# Patient Record
Sex: Female | Born: 1975 | Race: Black or African American | Hispanic: No | Marital: Married | Smoking: Current every day smoker
Health system: Southern US, Community
[De-identification: ages and names within clinical notes are randomized; demographics above are authoritative.]

## PROBLEM LIST (undated history)

## (undated) DIAGNOSIS — M436 Torticollis: Secondary | ICD-10-CM

## (undated) DIAGNOSIS — I10 Essential (primary) hypertension: Secondary | ICD-10-CM

## (undated) DIAGNOSIS — E785 Hyperlipidemia, unspecified: Secondary | ICD-10-CM

## (undated) DIAGNOSIS — M5481 Occipital neuralgia: Secondary | ICD-10-CM

## (undated) HISTORY — DX: Torticollis: M43.6

## (undated) HISTORY — DX: Hyperlipidemia, unspecified: E78.5

---

## 2000-01-13 HISTORY — PX: TUBAL LIGATION: SHX77

## 2004-04-03 ENCOUNTER — Ambulatory Visit: Payer: Self-pay | Admitting: Internal Medicine

## 2004-04-04 ENCOUNTER — Ambulatory Visit: Payer: Self-pay | Admitting: Internal Medicine

## 2004-09-22 ENCOUNTER — Emergency Department: Payer: Self-pay | Admitting: Emergency Medicine

## 2005-04-07 ENCOUNTER — Ambulatory Visit: Payer: Self-pay | Admitting: Family Medicine

## 2005-09-30 ENCOUNTER — Ambulatory Visit: Payer: Self-pay | Admitting: Otolaryngology

## 2005-10-01 ENCOUNTER — Ambulatory Visit: Payer: Self-pay | Admitting: Otolaryngology

## 2005-10-21 ENCOUNTER — Ambulatory Visit: Payer: Self-pay

## 2005-11-14 ENCOUNTER — Ambulatory Visit: Payer: Self-pay | Admitting: Urology

## 2005-11-15 ENCOUNTER — Emergency Department: Payer: Self-pay | Admitting: Emergency Medicine

## 2005-11-16 ENCOUNTER — Ambulatory Visit: Payer: Self-pay | Admitting: Emergency Medicine

## 2006-12-28 ENCOUNTER — Ambulatory Visit: Payer: Self-pay | Admitting: Gastroenterology

## 2007-05-25 ENCOUNTER — Ambulatory Visit: Payer: Self-pay | Admitting: Internal Medicine

## 2007-05-27 ENCOUNTER — Ambulatory Visit: Payer: Self-pay | Admitting: Internal Medicine

## 2007-06-02 ENCOUNTER — Ambulatory Visit: Payer: Self-pay | Admitting: Internal Medicine

## 2007-08-08 ENCOUNTER — Ambulatory Visit: Payer: Self-pay | Admitting: Family

## 2007-08-15 ENCOUNTER — Ambulatory Visit: Payer: Self-pay | Admitting: Unknown Physician Specialty

## 2012-01-13 LAB — HM MAMMOGRAPHY: HM Mammogram: NORMAL (ref 0–4)

## 2013-01-12 LAB — HM PAP SMEAR: HM PAP: NORMAL

## 2014-01-12 LAB — HM COLONOSCOPY

## 2014-07-31 ENCOUNTER — Encounter: Payer: Self-pay | Admitting: Internal Medicine

## 2014-07-31 ENCOUNTER — Other Ambulatory Visit: Payer: Self-pay | Admitting: Internal Medicine

## 2014-07-31 DIAGNOSIS — I10 Essential (primary) hypertension: Secondary | ICD-10-CM | POA: Insufficient documentation

## 2014-07-31 DIAGNOSIS — F17200 Nicotine dependence, unspecified, uncomplicated: Secondary | ICD-10-CM | POA: Insufficient documentation

## 2014-07-31 DIAGNOSIS — M5481 Occipital neuralgia: Secondary | ICD-10-CM | POA: Insufficient documentation

## 2014-09-01 ENCOUNTER — Emergency Department
Admission: EM | Admit: 2014-09-01 | Discharge: 2014-09-01 | Disposition: A | Discharge status: Home / Self Care | Payer: 59 | Attending: Emergency Medicine | Admitting: Emergency Medicine

## 2014-09-01 DIAGNOSIS — Z720 Tobacco use: Secondary | ICD-10-CM | POA: Diagnosis not present

## 2014-09-01 DIAGNOSIS — Z79899 Other long term (current) drug therapy: Secondary | ICD-10-CM | POA: Diagnosis not present

## 2014-09-01 DIAGNOSIS — M542 Cervicalgia: Secondary | ICD-10-CM | POA: Diagnosis present

## 2014-09-01 DIAGNOSIS — I10 Essential (primary) hypertension: Secondary | ICD-10-CM | POA: Diagnosis not present

## 2014-09-01 DIAGNOSIS — M436 Torticollis: Secondary | ICD-10-CM | POA: Diagnosis not present

## 2014-09-01 HISTORY — DX: Occipital neuralgia: M54.81

## 2014-09-01 HISTORY — DX: Essential (primary) hypertension: I10

## 2014-09-01 MED ORDER — CYCLOBENZAPRINE HCL 10 MG PO TABS: 10.0000 mg | ORAL_TABLET | Freq: Three times a day (TID) | ORAL | Status: DC | PRN

## 2014-09-01 MED ORDER — ORPHENADRINE CITRATE 30 MG/ML IJ SOLN
60.0000 mg | Freq: Two times a day (BID) | INTRAMUSCULAR | Status: DC
  Administered 2014-09-01: 60 mg via INTRAMUSCULAR
  Filled 2014-09-01: qty 2

## 2014-09-01 MED ORDER — KETOROLAC TROMETHAMINE 60 MG/2ML IM SOLN
60.0000 mg | Freq: Once | INTRAMUSCULAR | Status: AC
  Administered 2014-09-01: 60 mg via INTRAMUSCULAR
  Filled 2014-09-01: qty 2

## 2014-09-01 MED ORDER — TRAMADOL HCL 50 MG PO TABS
50.0000 mg | ORAL_TABLET | Freq: Once | ORAL | Status: AC
  Administered 2014-09-01: 50 mg via ORAL
  Filled 2014-09-01: qty 1

## 2014-09-01 MED ORDER — IBUPROFEN 800 MG PO TABS: 800.0000 mg | ORAL_TABLET | Freq: Three times a day (TID) | ORAL | Status: DC | PRN

## 2014-09-01 MED ORDER — TRAMADOL HCL 50 MG PO TABS: 50.0000 mg | ORAL_TABLET | Freq: Four times a day (QID) | ORAL | Status: DC | PRN

## 2014-09-01 NOTE — ED Notes (Signed)
Patient reports neck pain, especially left side, for several days.

## 2014-09-01 NOTE — ED Provider Notes (Signed)
[facility] Emergency Department Provider Note  ____________________________________________  Time seen: Approximately 7:51 AM  I have reviewed the triage vital signs and the nursing notes.   HISTORY  Chief Complaint Torticollis    HPI Felicia Garcia is a 39 y.o. female complaining of left neck pain times one week. Patient state awaken one week ago with neck pain status postand her pillow. Patient state pain not relieved with over-the-counter anti-inflammatory medications. Patient states she uses a TENS unit and had relief for 2 days but is no longer affect. Patient denies any radicular component to her pain and no loss of function of the upper extremities. Patient is rating the pain as a 10 over 10 and describes a sharp with any right lateral flexion motions of the neck. Denies any mid neck pain. Past Medical History  Diagnosis Date  . Hypertension   . Occipital neuralgia     Patient Active Problem List   Diagnosis Date Noted  . Essential (primary) hypertension 07/31/2014  . Cervico-occipital neuralgia 07/31/2014  . Compulsive tobacco user syndrome 07/31/2014    Past Surgical History  Procedure Laterality Date  . Tubal ligation      Current Outpatient Rx  Name  Route  Sig  Dispense  Refill  . chlorthalidone (HYGROTON) 25 MG tablet   Oral   Take 1 tablet by mouth daily.         . cyclobenzaprine (FLEXERIL) 10 MG tablet   Oral   Take 1 tablet (10 mg total) by mouth every 8 (eight) hours as needed for muscle spasms.   15 tablet   0   . gabapentin (NEURONTIN) 100 MG capsule   Oral   Take 1 capsule by mouth 4 (four) times daily.         Marland Kitchen ibuprofen (ADVIL,MOTRIN) 800 MG tablet   Oral   Take 1 tablet (800 mg total) by mouth every 8 (eight) hours as needed for moderate pain.   15 tablet   0   . traMADol (ULTRAM) 50 MG tablet   Oral   Take 1 tablet (50 mg total) by mouth every 6 (six) hours as needed for moderate pain.   12 tablet   0      Allergies Flu virus vaccine and Betadine  Family History  Problem Relation Age of Onset  . Hypertension Mother   . Hypertension Sister   . Diabetes Sister     Social History Social History  Substance Use Topics  . Smoking status: Current Every Day Smoker    Types: Cigarettes  . Smokeless tobacco: None  . Alcohol Use: 1.2 oz/week    2 Standard drinks or equivalent per week    Review of Systems Constitutional: No fever/chills Eyes: No visual changes. ENT: No sore throat. Cardiovascular: Denies chest pain. Respiratory: Denies shortness of breath. Gastrointestinal: No abdominal pain.  No nausea, no vomiting.  No diarrhea.  No constipation. Genitourinary: Negative for dysuria. Musculoskeletal: Negative for back pain. Skin: Negative for rash. Neurological: Negative for headaches, focal weakness or numbness. Endocrine:Hypertension Hematological/Lymphatic: Allergic/Immunilogical: See medication list  10-point ROS otherwise negative.  ____________________________________________   PHYSICAL EXAM:  VITAL SIGNS: ED Triage Vitals  Enc Vitals Group     BP 09/01/14 0641 127/85 mmHg     Pulse Rate 09/01/14 0641 95     Resp 09/01/14 0641 22     Temp 09/01/14 0641 98.2 F (36.8 C)     Temp Source 09/01/14 0641 Oral     SpO2 09/01/14  0641 96 %     Weight 09/01/14 0641 191 lb (86.637 kg)     Height 09/01/14 0641 5' 3.5" (1.613 m)     Head Cir --      Peak Flow --      Pain Score 09/01/14 0641 10     Pain Loc --      Pain Edu? --      Excl. in GC? --     Constitutional: Alert and oriented. Well appearing and in no acute distress. Eyes: Conjunctivae are normal. PERRL. EOMI. Head: Atraumatic. Nose: No congestion/rhinnorhea. Mouth/Throat: Mucous membranes are moist.  Oropharynx non-erythematous. Neck: No stridor. No obvious spinal deformity.  No cervical spine tenderness to palpation. Patient has some moderate guarding palpation left paraspinal muscle group. Patient  has decreased range of motion with flexion and right lateral motions. Hematological/Lymphatic/Immunilogical: No cervical lymphadenopathy. Cardiovascular: Normal rate, regular rhythm. Grossly normal heart sounds.  Good peripheral circulation. Respiratory: Normal respiratory effort.  No retractions. Lungs CTAB. Gastrointestinal: Soft and nontender. No distention. No abdominal bruits. No CVA tenderness. Musculoskeletal: No lower extremity tenderness nor edema.  No joint effusions. Neurologic:  Normal speech and language. No gross focal neurologic deficits are appreciated. No gait instability. Skin:  Skin is warm, dry and intact. No rash noted. Psychiatric: Mood and affect are normal. Speech and behavior are normal.  ____________________________________________   LABS (all labs ordered are listed, but only abnormal results are displayed)  Labs Reviewed - No data to display ____________________________________________  EKG   ____________________________________________  RADIOLOGY   ____________________________________________   PROCEDURES  Procedure(s) performed: None  Critical Care performed: No  ____________________________________________   INITIAL IMPRESSION / ASSESSMENT AND PLAN / ED COURSE  Pertinent labs & imaging results that were available during my care of the patient were reviewed by me and considered in my medical decision making (see chart for details).  Torticollis. Patient given injection of Norflex 60 mg, Toradol 60 mg, and tramadol 50 mg by mouth. Patient discharged home with instruction for her condition. Patient given a prescription for ibuprofen, Flexeril, and tramadol. Patient advised follow-up with her PCP if condition persists.  ____________________________________________   FINAL CLINICAL IMPRESSION(S) / ED DIAGNOSES  Final diagnoses:  Torticollis, acute      Joni Reining, PA-C 09/01/14 1610  Minna Antis, MD 09/01/14 [phone]

## 2015-01-03 ENCOUNTER — Other Ambulatory Visit: Payer: Self-pay | Admitting: Internal Medicine

## 2015-01-10 NOTE — Telephone Encounter (Signed)
Pt had a heart attack last week and is going to a heart specialist and said she would call back to make this appt

## 2015-01-17 DIAGNOSIS — R0789 Other chest pain: Secondary | ICD-10-CM | POA: Insufficient documentation

## 2015-01-18 DIAGNOSIS — R0601 Orthopnea: Secondary | ICD-10-CM | POA: Insufficient documentation

## 2015-01-19 ENCOUNTER — Other Ambulatory Visit: Payer: Self-pay | Admitting: Internal Medicine

## 2015-01-21 DIAGNOSIS — E782 Mixed hyperlipidemia: Secondary | ICD-10-CM | POA: Insufficient documentation

## 2015-03-22 ENCOUNTER — Other Ambulatory Visit: Payer: Self-pay | Admitting: Internal Medicine

## 2015-04-15 DIAGNOSIS — F410 Panic disorder [episodic paroxysmal anxiety] without agoraphobia: Secondary | ICD-10-CM | POA: Insufficient documentation

## 2015-04-15 DIAGNOSIS — Z860101 Personal history of adenomatous and serrated colon polyps: Secondary | ICD-10-CM | POA: Insufficient documentation

## 2015-04-15 DIAGNOSIS — Z86010 Personal history of colonic polyps: Secondary | ICD-10-CM | POA: Insufficient documentation

## 2016-10-02 ENCOUNTER — Emergency Department
Admission: EM | Admit: 2016-10-02 | Discharge: 2016-10-03 | Disposition: A | Discharge status: Home / Self Care | Payer: BLUE CROSS/BLUE SHIELD | Attending: Student in an Organized Health Care Education/Training Program | Admitting: Student in an Organized Health Care Education/Training Program

## 2016-10-02 ENCOUNTER — Encounter: Payer: Self-pay | Admitting: Emergency Medicine

## 2016-10-02 ENCOUNTER — Emergency Department: Payer: BLUE CROSS/BLUE SHIELD

## 2016-10-02 DIAGNOSIS — Z79899 Other long term (current) drug therapy: Secondary | ICD-10-CM | POA: Diagnosis not present

## 2016-10-02 DIAGNOSIS — F17210 Nicotine dependence, cigarettes, uncomplicated: Secondary | ICD-10-CM | POA: Diagnosis not present

## 2016-10-02 DIAGNOSIS — K5641 Fecal impaction: Secondary | ICD-10-CM | POA: Diagnosis not present

## 2016-10-02 DIAGNOSIS — I10 Essential (primary) hypertension: Secondary | ICD-10-CM | POA: Diagnosis not present

## 2016-10-02 DIAGNOSIS — K6289 Other specified diseases of anus and rectum: Secondary | ICD-10-CM | POA: Diagnosis present

## 2016-10-02 DIAGNOSIS — R197 Diarrhea, unspecified: Secondary | ICD-10-CM | POA: Insufficient documentation

## 2016-10-02 DIAGNOSIS — K5900 Constipation, unspecified: Secondary | ICD-10-CM

## 2016-10-02 LAB — COMPREHENSIVE METABOLIC PANEL
ALBUMIN: 4.4 g/dL (ref 3.5–5.0)
ALK PHOS: 69 U/L (ref 38–126)
ALT: 17 U/L (ref 14–54)
AST: 18 U/L (ref 15–41)
Anion gap: 9 (ref 5–15)
BILIRUBIN TOTAL: 0.8 mg/dL (ref 0.3–1.2)
BUN: 10 mg/dL (ref 6–20)
CALCIUM: 9.3 mg/dL (ref 8.9–10.3)
CO2: 28 mmol/L (ref 22–32)
CREATININE: 0.89 mg/dL (ref 0.44–1.00)
Chloride: 100 mmol/L — ABNORMAL LOW (ref 101–111)
GFR calc Af Amer: >60 mL/min (ref >60)
GFR calc non Af Amer: >60 mL/min (ref >60)
GLUCOSE: 155 mg/dL — AB (ref 65–99)
Potassium: 3.9 mmol/L (ref 3.5–5.1)
SODIUM: 137 mmol/L (ref 135–145)
Total Protein: 7.6 g/dL (ref 6.5–8.1)

## 2016-10-02 LAB — CBC WITH DIFFERENTIAL/PLATELET
BASOS PCT: 1 %
Basophils Absolute: 0.1 10*3/uL (ref 0–0.1)
EOS ABS: 0 10*3/uL (ref 0–0.7)
Eosinophils Relative: 0 %
HEMATOCRIT: 44.4 % (ref 35.0–47.0)
Hemoglobin: 15.5 g/dL (ref 12.0–16.0)
LYMPHS ABS: 1.1 10*3/uL (ref 1.0–3.6)
Lymphocytes Relative: 5 %
MCH: 32.9 pg (ref 26.0–34.0)
MCHC: 34.9 g/dL (ref 32.0–36.0)
MCV: 94.4 fL (ref 80.0–100.0)
MONO ABS: 1.1 10*3/uL — AB (ref 0.2–0.9)
MONOS PCT: 5 %
Neutro Abs: 18.2 10*3/uL — ABNORMAL HIGH (ref 1.4–6.5)
Neutrophils Relative %: 89 %
Platelets: 338 10*3/uL (ref 150–440)
RBC: 4.7 MIL/uL (ref 3.80–5.20)
RDW: 12.5 % (ref 11.5–14.5)
WBC: 20.5 10*3/uL — ABNORMAL HIGH (ref 3.6–11.0)

## 2016-10-02 LAB — POCT PREGNANCY, URINE: Preg Test, Ur: NEGATIVE

## 2016-10-02 MED ORDER — DOXYCYCLINE HYCLATE 100 MG PO TABS
100.0000 mg | ORAL_TABLET | Freq: Once | ORAL | Status: AC
  Administered 2016-10-02: 100 mg via ORAL
  Filled 2016-10-02: qty 1

## 2016-10-02 MED ORDER — DOXYCYCLINE HYCLATE 100 MG PO TABS
100.0000 mg | ORAL_TABLET | Freq: Two times a day (BID) | ORAL | 0 refills | Status: AC
Start: 2016-10-02 — End: 2016-10-14

## 2016-10-02 MED ORDER — FLUCONAZOLE 150 MG PO TABS: 150.0000 mg | ORAL_TABLET | Freq: Every day | ORAL | 0 refills | Status: AC

## 2016-10-02 MED ORDER — CEFTRIAXONE SODIUM 250 MG IJ SOLR
250.0000 mg | Freq: Once | INTRAMUSCULAR | Status: AC
  Administered 2016-10-02: 250 mg via INTRAMUSCULAR
  Filled 2016-10-02: qty 250

## 2016-10-02 MED ORDER — IOPAMIDOL (ISOVUE-300) INJECTION 61%
100.0000 mL | Freq: Once | INTRAVENOUS | Status: AC | PRN
  Administered 2016-10-02: 100 mL via INTRAVENOUS

## 2016-10-02 MED ORDER — LIDOCAINE HCL 2 % EX GEL
1.0000 "application " | Freq: Once | CUTANEOUS | Status: AC
  Administered 2016-10-02: 1 via URETHRAL
  Filled 2016-10-02: qty 5

## 2016-10-02 MED ORDER — DOCUSATE SODIUM 100 MG PO CAPS
100.0000 mg | ORAL_CAPSULE | Freq: Once | ORAL | Status: AC
  Administered 2016-10-02: 100 mg via ORAL
  Filled 2016-10-02: qty 1

## 2016-10-02 MED ORDER — PROMETHAZINE HCL 25 MG/ML IJ SOLN
12.5000 mg | Freq: Four times a day (QID) | INTRAMUSCULAR | Status: DC | PRN
  Administered 2016-10-02: 12.5 mg via INTRAVENOUS
  Filled 2016-10-02: qty 1

## 2016-10-02 MED ORDER — FENTANYL CITRATE (PF) 100 MCG/2ML IJ SOLN
100.0000 ug | INTRAMUSCULAR | Status: DC | PRN
  Administered 2016-10-02 – 2016-10-03 (×2): 100 ug via INTRAVENOUS
  Filled 2016-10-02 (×2): qty 2

## 2016-10-02 MED ORDER — LIDOCAINE HCL 2 % EX GEL
CUTANEOUS | Status: AC
  Filled 2016-10-02: qty 10

## 2016-10-02 MED ORDER — LIDOCAINE HCL 2 % EX GEL
1.0000 "application " | Freq: Once | CUTANEOUS | Status: AC
  Administered 2016-10-02: 1 via TOPICAL

## 2016-10-02 MED ORDER — PROMETHAZINE HCL 12.5 MG PO TABS: 12.5000 mg | ORAL_TABLET | Freq: Four times a day (QID) | ORAL | 0 refills | Status: DC | PRN

## 2016-10-02 MED ORDER — MAGNESIUM CITRATE PO SOLN
1.0000 | Freq: Once | ORAL | Status: AC
  Administered 2016-10-02: 1 via ORAL
  Filled 2016-10-02: qty 296

## 2016-10-02 NOTE — ED Notes (Signed)
ED Provider at bedside. 

## 2016-10-02 NOTE — Discharge Instructions (Signed)
please take antibiotics as distracted. Return for any fevers or worsening pain. Follow-up with gastroenterology.    You have been seen in the emergency department for emergency care. It is important that you contact your own doctor, specialist or the closest clinic for follow-up care. Please bring this instruction sheet, all medications and X-ray copies with you when you are seen for follow-up care.  Determining the exact cause for all patients with abdominal pain is extremely difficult in the emergency department. Our primary focus is to rule-out immediate life-threatening diseases. If no immediate source of pain is found the definitive diagnosis frequently needs to be determined over time.Many times your primary care physician can determine the cause by following the symptoms over time. Sometimes, specialist are required such as Gastroenterologists, Gynecologists, Urologists or Surgeons. Please return immediately to the Emergency Department for fever>101, Vomiting or Intractable Pain. You should return to the emergency department or see your primary care provider in 12-24hrs if your pain is no better and sooner if your pain becomes worse.

## 2016-10-02 NOTE — ED Provider Notes (Signed)
[facility] Emergency Department Provider Note    First MD Initiated Contact with Patient 10/02/16 1933     (approximate)  I have reviewed the triage vital signs and the nursing notes.   HISTORY  Chief Complaint Fecal Impaction    HPI Felicia Garcia is a 41 y.o. female presents with worsening perirectal pain and constipation for the past 5 days. Patient tried to self disimpact herself has not had any relief in symptoms. States that she is having just diarrhea after taking mag citrate as well as Dulcolax and MiraLAX. Is also having diffuse lower abdominal pain. Denies any dysuria. No vaginal discharge. Does have a history of hemorrhoids and feels that she's having significant pain from hemorrhoids.  Currently rates the pain as mild to moderate. No fevers.   Past Medical History:  Diagnosis Date  . Hypertension   . Occipital neuralgia    Family History  Problem Relation Age of Onset  . Hypertension Mother   . Hypertension Sister   . Diabetes Sister    Past Surgical History:  Procedure Laterality Date  . TUBAL LIGATION     Patient Active Problem List   Diagnosis Date Noted  . Essential (primary) hypertension 07/31/2014  . Cervico-occipital neuralgia 07/31/2014  . Compulsive tobacco user syndrome 07/31/2014      Prior to Admission medications   Medication Sig Start Date End Date Taking? Authorizing Provider  chlorthalidone (HYGROTON) 25 MG tablet take 1 tablet by mouth once daily 01/03/15   Reubin Milan, MD  cyclobenzaprine (FLEXERIL) 10 MG tablet Take 1 tablet (10 mg total) by mouth every 8 (eight) hours as needed for muscle spasms. 09/01/14   Joni Reining, PA-C  doxycycline (VIBRA-TABS) 100 MG tablet Take 1 tablet (100 mg total) by mouth 2 (two) times daily. 10/02/16 10/14/16  Willy Eddy, MD  fluconazole (DIFLUCAN) 150 MG tablet Take 1 tablet (150 mg total) by mouth daily. 10/02/16 10/05/16  Willy Eddy, MD  gabapentin  (NEURONTIN) 100 MG capsule Take 1 capsule by mouth 4 (four) times daily. 06/05/14   [provider]  ibuprofen (ADVIL,MOTRIN) 800 MG tablet Take 1 tablet (800 mg total) by mouth every 8 (eight) hours as needed for moderate pain. 09/01/14   Joni Reining, PA-C  promethazine (PHENERGAN) 12.5 MG tablet Take 1 tablet (12.5 mg total) by mouth every 6 (six) hours as needed for nausea or vomiting. 10/02/16   Willy Eddy, MD  traMADol (ULTRAM) 50 MG tablet Take 1 tablet (50 mg total) by mouth every 6 (six) hours as needed for moderate pain. 09/01/14   Joni Reining, PA-C    Allergies Flu virus vaccine and Betadine [povidone iodine]    Social History Social History  Substance Use Topics  . Smoking status: Current Every Day Smoker    Types: Cigarettes  . Smokeless tobacco: Never Used  . Alcohol use 1.2 oz/week    2 Standard drinks or equivalent per week    Review of Systems Patient denies headaches, rhinorrhea, blurry vision, numbness, shortness of breath, chest pain, edema, cough, abdominal pain, nausea, vomiting, diarrhea, dysuria, fevers, rashes or hallucinations unless otherwise stated above in HPI. ____________________________________________   PHYSICAL EXAM:  VITAL SIGNS: Vitals:   10/02/16 1735 10/02/16 2200  BP: (!) 131/99 (!) 166/107  Pulse: 89 76  Resp: 16 15  Temp: 98.9 F (37.2 C)   SpO2: 99% 98%    Constitutional: Alert and oriented. Well appearing and in no acute distress. Eyes: Conjunctivae are  normal.  Head: Atraumatic. Nose: No congestion/rhinnorhea. Mouth/Throat: Mucous membranes are moist.   Neck: No stridor. Painless ROM.  Cardiovascular: Normal rate, regular rhythm. Grossly normal heart sounds.  Good peripheral circulation. Respiratory: Normal respiratory effort.  No retractions. Lungs CTAB. Gastrointestinal: Soft and nontender. No distention. No abdominal bruits. No CVA tenderness. Genitourinary: Tender rectal exam without fluctuance, no,  thrombosed hemorrhoids, no fissure noted Musculoskeletal: No lower extremity tenderness nor edema.  No joint effusions. Neurologic:  Normal speech and language. No gross focal neurologic deficits are appreciated. No facial droop Skin:  Skin is warm, dry and intact. No rash noted. Psychiatric: Mood and affect are normal. Speech and behavior are normal.  ____________________________________________   LABS (all labs ordered are listed, but only abnormal results are displayed)  Results for orders placed or performed during the hospital encounter of 10/02/16 (from the past 24 hour(s))  CBC with Differential     Status: Abnormal   Collection Time: 10/02/16  5:39 PM  Result Value Ref Range   WBC 20.5 (H) 3.6 - 11.0 K/uL   RBC 4.70 3.80 - 5.20 MIL/uL   Hemoglobin 15.5 12.0 - 16.0 g/dL   HCT 11.9 14.7 - 82.9 %   MCV 94.4 80.0 - 100.0 fL   MCH 32.9 26.0 - 34.0 pg   MCHC 34.9 32.0 - 36.0 g/dL   RDW 56.2 13.0 - 86.5 %   Platelets 338 150 - 440 K/uL   Neutrophils Relative % 89 %   Neutro Abs 18.2 (H) 1.4 - 6.5 K/uL   Lymphocytes Relative 5 %   Lymphs Abs 1.1 1.0 - 3.6 K/uL   Monocytes Relative 5 %   Monocytes Absolute 1.1 (H) 0.2 - 0.9 K/uL   Eosinophils Relative 0 %   Eosinophils Absolute 0.0 0 - 0.7 K/uL   Basophils Relative 1 %   Basophils Absolute 0.1 0 - 0.1 K/uL  Comprehensive metabolic panel     Status: Abnormal   Collection Time: 10/02/16  5:39 PM  Result Value Ref Range   Sodium 137 135 - 145 mmol/L   Potassium 3.9 3.5 - 5.1 mmol/L   Chloride 100 (L) 101 - 111 mmol/L   CO2 28 22 - 32 mmol/L   Glucose, Bld 155 (H) 65 - 99 mg/dL   BUN 10 6 - 20 mg/dL   Creatinine, Ser 7.84 0.44 - 1.00 mg/dL   Calcium 9.3 8.9 - 69.6 mg/dL   Total Protein 7.6 6.5 - 8.1 g/dL   Albumin 4.4 3.5 - 5.0 g/dL   AST 18 15 - 41 U/L   ALT 17 14 - 54 U/L   Alkaline Phosphatase 69 38 - 126 U/L   Total Bilirubin 0.8 0.3 - 1.2 mg/dL   GFR calc non Af Amer >60 >60 mL/min   GFR calc Af Amer >60 >60 mL/min    Anion gap 9 5 - 15  Pregnancy, urine POC     Status: None   Collection Time: 10/02/16  5:54 PM  Result Value Ref Range   Preg Test, Ur NEGATIVE NEGATIVE   ____________________________________________ ____________________________________________  RADIOLOGY  I personally reviewed all radiographic images ordered to evaluate for the above acute complaints and reviewed radiology reports and findings.  These findings were personally discussed with the patient.  Please see medical record for radiology report.  ____________________________________________   PROCEDURES  Procedure(s) performed:  Procedures    Critical Care performed: no ____________________________________________   INITIAL IMPRESSION / ASSESSMENT AND PLAN / ED COURSE  Pertinent labs &  imaging results that were available during my care of the patient were reviewed by me and considered in my medical decision making (see chart for details).  DDX: perirectal abcscess, procitis, colitis, fissure, hemhroid, impaction  Felicia Garcia is a 41 y.o. who presents to the ED with rectal pain as described above. Currently afebrile but has a leukocytosis of 20,000 and very tender exam with no clear evidence of fecal impaction. Only soft stool in the rectal vault at this time. No evidence of fissure or hemorrhoid.  CT imaging will be ordered to exclude more of significant deep space infection or abscess.  Clinical Course as of Oct 03 2326  Fri Oct 02, 2016  2227 Reviewed images from CT scan and report per radiology. Presentation is consistent with acute proctitis. Does have large stool ball and rectum not amenable to manual disimpaction. We'll give enema as well as give Rocephin and doxycycline for treatment of proctitis.  Patient was able to tolerate PO and was able to ambulate with a steady gait.  Have discussed with the patient and available family all diagnostics and treatments performed thus far and all questions were answered  to the best of my ability. The patient demonstrates understanding and agreement with plan.   [PR]    Clinical Course User Index [PR] Willy Eddy, MD     ____________________________________________   FINAL CLINICAL IMPRESSION(S) / ED DIAGNOSES  Final diagnoses:  Fecal impaction in rectum (HCC)  Proctitis      NEW MEDICATIONS STARTED DURING THIS VISIT:  New Prescriptions   DOXYCYCLINE (VIBRA-TABS) 100 MG TABLET    Take 1 tablet (100 mg total) by mouth 2 (two) times daily.   FLUCONAZOLE (DIFLUCAN) 150 MG TABLET    Take 1 tablet (150 mg total) by mouth daily.   PROMETHAZINE (PHENERGAN) 12.5 MG TABLET    Take 1 tablet (12.5 mg total) by mouth every 6 (six) hours as needed for nausea or vomiting.     Note:  This document was prepared using Dragon voice recognition software and may include unintentional dictation errors.    Willy Eddy, MD 10/02/16 2328

## 2016-10-02 NOTE — ED Triage Notes (Signed)
Pt to ED from [facility] for Constipation x 5 days. Pt states that MD was concerned that she may have a bowel blockage. Pt states that she has used OTC medications and tried to disimpact herself without relief. Pt states that she can feel large amounts of stool in her rectum. Pt states that when she does try to have a bowel movement she only passes water.

## 2016-10-05 ENCOUNTER — Emergency Department
Admission: EM | Admit: 2016-10-05 | Discharge: 2016-10-05 | Disposition: A | Discharge status: Home / Self Care | Payer: BLUE CROSS/BLUE SHIELD | Attending: Emergency Medicine | Admitting: Emergency Medicine

## 2016-10-05 ENCOUNTER — Encounter: Payer: Self-pay | Admitting: Emergency Medicine

## 2016-10-05 DIAGNOSIS — F17210 Nicotine dependence, cigarettes, uncomplicated: Secondary | ICD-10-CM | POA: Diagnosis not present

## 2016-10-05 DIAGNOSIS — K6289 Other specified diseases of anus and rectum: Secondary | ICD-10-CM | POA: Insufficient documentation

## 2016-10-05 DIAGNOSIS — I10 Essential (primary) hypertension: Secondary | ICD-10-CM | POA: Insufficient documentation

## 2016-10-05 DIAGNOSIS — Z79899 Other long term (current) drug therapy: Secondary | ICD-10-CM | POA: Diagnosis not present

## 2016-10-05 DIAGNOSIS — R109 Unspecified abdominal pain: Secondary | ICD-10-CM | POA: Diagnosis present

## 2016-10-05 LAB — COMPREHENSIVE METABOLIC PANEL
ALBUMIN: 3.7 g/dL (ref 3.5–5.0)
ALK PHOS: 49 U/L (ref 38–126)
ALT: 15 U/L (ref 14–54)
AST: 15 U/L (ref 15–41)
Anion gap: 10 (ref 5–15)
BUN: 12 mg/dL (ref 6–20)
CALCIUM: 8.9 mg/dL (ref 8.9–10.3)
CO2: 25 mmol/L (ref 22–32)
CREATININE: 0.91 mg/dL (ref 0.44–1.00)
Chloride: 101 mmol/L (ref 101–111)
GFR calc non Af Amer: >60 mL/min (ref >60)
GLUCOSE: 107 mg/dL — AB (ref 65–99)
Potassium: 3.6 mmol/L (ref 3.5–5.1)
SODIUM: 136 mmol/L (ref 135–145)
Total Bilirubin: 0.7 mg/dL (ref 0.3–1.2)
Total Protein: 7.2 g/dL (ref 6.5–8.1)

## 2016-10-05 LAB — URINALYSIS, COMPLETE (UACMP) WITH MICROSCOPIC
Bilirubin Urine: NEGATIVE
Glucose, UA: NEGATIVE mg/dL
Hgb urine dipstick: NEGATIVE
Ketones, ur: NEGATIVE mg/dL
Leukocytes, UA: NEGATIVE
Nitrite: NEGATIVE
PH: 6 (ref 5.0–8.0)
Protein, ur: 30 mg/dL — AB
SPECIFIC GRAVITY, URINE: 1.02 (ref 1.005–1.030)

## 2016-10-05 LAB — CBC
HCT: 40.3 % (ref 35.0–47.0)
Hemoglobin: 14.4 g/dL (ref 12.0–16.0)
MCH: 33.2 pg (ref 26.0–34.0)
MCHC: 35.7 g/dL (ref 32.0–36.0)
MCV: 93.1 fL (ref 80.0–100.0)
PLATELETS: 301 10*3/uL (ref 150–440)
RBC: 4.34 MIL/uL (ref 3.80–5.20)
RDW: 12.8 % (ref 11.5–14.5)
WBC: 8.3 10*3/uL (ref 3.6–11.0)

## 2016-10-05 LAB — LIPASE, BLOOD: Lipase: 15 U/L (ref 11–51)

## 2016-10-05 MED ORDER — OXYCODONE-ACETAMINOPHEN 5-325 MG PO TABS
1.0000 | ORAL_TABLET | Freq: Once | ORAL | Status: AC
  Administered 2016-10-05: 1 via ORAL
  Filled 2016-10-05: qty 1

## 2016-10-05 MED ORDER — OXYCODONE-ACETAMINOPHEN 5-325 MG PO TABS: 1.0000 | ORAL_TABLET | Freq: Four times a day (QID) | ORAL | 0 refills | Status: DC | PRN

## 2016-10-05 MED ORDER — MORPHINE SULFATE (PF) 4 MG/ML IV SOLN
8.0000 mg | Freq: Once | INTRAVENOUS | Status: AC
  Administered 2016-10-05: 8 mg via INTRAVENOUS
  Filled 2016-10-05: qty 2

## 2016-10-05 MED ORDER — IBUPROFEN 600 MG PO TABS: 600.0000 mg | ORAL_TABLET | Freq: Three times a day (TID) | ORAL | 0 refills | Status: DC | PRN

## 2016-10-05 MED ORDER — KETOROLAC TROMETHAMINE 30 MG/ML IJ SOLN
15.0000 mg | Freq: Once | INTRAMUSCULAR | Status: AC
  Administered 2016-10-05: 15 mg via INTRAVENOUS
  Filled 2016-10-05: qty 1

## 2016-10-05 MED ORDER — LIDOCAINE HCL 2 % EX GEL
1.0000 "application " | Freq: Once | CUTANEOUS | Status: AC
  Administered 2016-10-05: 1 via TOPICAL
  Filled 2016-10-05: qty 5

## 2016-10-05 NOTE — Discharge Instructions (Signed)
Please call the GI clinic tomorrow to see if you're able to schedule a sooner appointment. Use your pain medication only as needed for severe pain and return to the emergency department in 2 days if your pain has not drastically improved.  It was a pleasure to take care of you today, and thank you for coming to our emergency department.  If you have any questions or concerns before leaving please ask the nurse to grab me and I'm more than happy to go through your aftercare instructions again.  If you were prescribed any opioid pain medication today such as Norco, Vicodin, Percocet, morphine, hydrocodone, or oxycodone please make sure you do not drive when you are taking this medication as it can alter your ability to drive safely.  If you have any concerns once you are home that you are not improving or are in fact getting worse before you can make it to your follow-up appointment, please do not hesitate to call 911 and come back for further evaluation.  Merrily Brittle, MD  Results for orders placed or performed during the hospital encounter of 10/05/16  Lipase, blood  Result Value Ref Range   Lipase 15 11 - 51 U/L  Comprehensive metabolic panel  Result Value Ref Range   Sodium 136 135 - 145 mmol/L   Potassium 3.6 3.5 - 5.1 mmol/L   Chloride 101 101 - 111 mmol/L   CO2 25 22 - 32 mmol/L   Glucose, Bld 107 (H) 65 - 99 mg/dL   BUN 12 6 - 20 mg/dL   Creatinine, Ser 1.61 0.44 - 1.00 mg/dL   Calcium 8.9 8.9 - 09.6 mg/dL   Total Protein 7.2 6.5 - 8.1 g/dL   Albumin 3.7 3.5 - 5.0 g/dL   AST 15 15 - 41 U/L   ALT 15 14 - 54 U/L   Alkaline Phosphatase 49 38 - 126 U/L   Total Bilirubin 0.7 0.3 - 1.2 mg/dL   GFR calc non Af Amer >60 >60 mL/min   GFR calc Af Amer >60 >60 mL/min   Anion gap 10 5 - 15  CBC  Result Value Ref Range   WBC 8.3 3.6 - 11.0 K/uL   RBC 4.34 3.80 - 5.20 MIL/uL   Hemoglobin 14.4 12.0 - 16.0 g/dL   HCT 04.5 40.9 - 81.1 %   MCV 93.1 80.0 - 100.0 fL   MCH 33.2 26.0 - 34.0  pg   MCHC 35.7 32.0 - 36.0 g/dL   RDW 91.4 78.2 - 95.6 %   Platelets 301 150 - 440 K/uL  Urinalysis, Complete w Microscopic  Result Value Ref Range   Color, Urine YELLOW (A) YELLOW   APPearance HAZY (A) CLEAR   Specific Gravity, Urine 1.020 1.005 - 1.030   pH 6.0 5.0 - 8.0   Glucose, UA NEGATIVE NEGATIVE mg/dL   Hgb urine dipstick NEGATIVE NEGATIVE   Bilirubin Urine NEGATIVE NEGATIVE   Ketones, ur NEGATIVE NEGATIVE mg/dL   Protein, ur 30 (A) NEGATIVE mg/dL   Nitrite NEGATIVE NEGATIVE   Leukocytes, UA NEGATIVE NEGATIVE   RBC / HPF 0-5 0 - 5 RBC/hpf   WBC, UA 0-5 0 - 5 WBC/hpf   Bacteria, UA RARE (A) NONE SEEN   Squamous Epithelial / LPF 6-30 (A) NONE SEEN   Mucus PRESENT    Ca Oxalate Crys, UA PRESENT    Dg Abd 1 View  Result Date: 10/02/2016 CLINICAL DATA:  Constipation for the past 5 days. EXAM: ABDOMEN - 1  VIEW COMPARISON:  Abdomen pelvis CT dated 11/14/2005. FINDINGS: Normal bowel gas pattern. No stool seen in the colon or rectum. Intrauterine device in expected position in the central pelvis. Right tubal ligation clip. Unremarkable bones. IMPRESSION: Normal examination.  No stool seen. Electronically Signed   By: Beckie Salts M.D.   On: 10/02/2016 18:28   Ct Abdomen Pelvis W Contrast  Result Date: 10/02/2016 CLINICAL DATA:  Abdominal pain constipation EXAM: CT ABDOMEN AND PELVIS WITH CONTRAST TECHNIQUE: Multidetector CT imaging of the abdomen and pelvis was performed using the standard protocol following bolus administration of intravenous contrast. CONTRAST:  ISOVUE-300 IOPAMIDOL (ISOVUE-300) INJECTION 61% COMPARISON:  10/02/2016 radiograph, CT 11/14/2005 FINDINGS: Lower chest: No acute abnormality. Hepatobiliary: No focal liver abnormality is seen. No gallstones, gallbladder wall thickening, or biliary dilatation. Pancreas: Unremarkable. No pancreatic ductal dilatation or surrounding inflammatory changes. Spleen: Normal in size without focal abnormality. Adrenals/Urinary  Tract: Adrenal glands are unremarkable. Kidneys are normal, without renal calculi, focal lesion, or hydronephrosis. Bladder is unremarkable. Stomach/Bowel: Stomach is nonenlarged. No dilated small bowel. Diffuse liquid stools throughout the colon. Normal appendix. Distension of the rectum up to 6.3 cm with impacted stool. There appears to be fluid and edema in the perirectal region. There is possible wall thickening of the rectum/anus. Vascular/Lymphatic: Nonaneurysmal aorta. No significantly enlarged lymph nodes. Prominent left greater than right parauterine vessels. Reproductive: Intrauterine device present. Tubal ligation clip on the right. No adnexal mass Other: Negative for free air. Musculoskeletal: No acute or significant osseous findings. IMPRESSION: 1. Negative for small bowel obstruction. 2. Rectal distension, due to a large impacted feces. The colon upstream to this is filled with liquid stools. There is fluid and edema surrounding the rectum consistent with inflammation. Soft tissue thickening is present in the region of rectum and anus, query proctitis. Electronically Signed   By: Jasmine Pang M.D.   On: 10/02/2016 21:33

## 2016-10-05 NOTE — ED Triage Notes (Signed)
States has had abdominal pain x 5 days. States was seem previously for impaction but that has resolved.

## 2016-10-05 NOTE — ED Provider Notes (Signed)
[facility] Emergency Department Provider Note  ____________________________________________   First MD Initiated Contact with Patient 10/05/16 1545     (approximate)  I have reviewed the triage vital signs and the nursing notes.   HISTORY  Chief Complaint Abdominal Pain    HPI Felicia Garcia is a 41 y.o. female who self presents to the emergency department with several days of slowly progressive severe rectal for constipation and she had not defecated in 5 days at that point. She had a CT scan showing a large impaction along with proctitis. Disimpaction was deferred and the patient was treated with oral medications. She has not had a solid bowel movement since but she has had numerous episodes of loose stools. Pain is severe in her rectum worse with defecating and improved somewhat with not. She is also nauseated. She reports compliance with her doxycycline.   Past Medical History:  Diagnosis Date  . Hypertension   . Occipital neuralgia     Patient Active Problem List   Diagnosis Date Noted  . Essential (primary) hypertension 07/31/2014  . Cervico-occipital neuralgia 07/31/2014  . Compulsive tobacco user syndrome 07/31/2014    Past Surgical History:  Procedure Laterality Date  . TUBAL LIGATION      Prior to Admission medications   Medication Sig Start Date End Date Taking? Authorizing Provider  chlorthalidone (HYGROTON) 25 MG tablet take 1 tablet by mouth once daily 01/03/15   Reubin Milan, MD  cyclobenzaprine (FLEXERIL) 10 MG tablet Take 1 tablet (10 mg total) by mouth every 8 (eight) hours as needed for muscle spasms. 09/01/14   Joni Reining, PA-C  doxycycline (VIBRA-TABS) 100 MG tablet Take 1 tablet (100 mg total) by mouth 2 (two) times daily. 10/02/16 10/14/16  Willy Eddy, MD  fluconazole (DIFLUCAN) 150 MG tablet Take 1 tablet (150 mg total) by mouth daily. 10/02/16 10/05/16  Willy Eddy, MD  gabapentin (NEURONTIN) 100 MG  capsule Take 1 capsule by mouth 4 (four) times daily. 06/05/14   [provider]  ibuprofen (ADVIL,MOTRIN) 600 MG tablet Take 1 tablet (600 mg total) by mouth every 8 (eight) hours as needed. 10/05/16   Merrily Brittle, MD  oxyCODONE-acetaminophen (ROXICET) 5-325 MG tablet Take 1 tablet by mouth every 6 (six) hours as needed for severe pain. 10/05/16   Merrily Brittle, MD  promethazine (PHENERGAN) 12.5 MG tablet Take 1 tablet (12.5 mg total) by mouth every 6 (six) hours as needed for nausea or vomiting. 10/02/16   Willy Eddy, MD  traMADol (ULTRAM) 50 MG tablet Take 1 tablet (50 mg total) by mouth every 6 (six) hours as needed for moderate pain. 09/01/14   Joni Reining, PA-C    Allergies Flu virus vaccine and Betadine [povidone iodine]  Family History  Problem Relation Age of Onset  . Hypertension Mother   . Hypertension Sister   . Diabetes Sister     Social History Social History  Substance Use Topics  . Smoking status: Current Every Day Smoker    Packs/day: 1.00    Types: Cigarettes  . Smokeless tobacco: Never Used  . Alcohol use 1.2 oz/week    2 Standard drinks or equivalent per week    Review of Systems Constitutional: No fever/chills Eyes: No visual changes. ENT: No sore throat. Cardiovascular: Denies chest pain. Respiratory: Denies shortness of breath. Gastrointestinal: positive abdominal pain.  positive for nausea, no vomiting.  positive for diarrhea.  No constipation. Genitourinary: Negative for dysuria. Musculoskeletal: Negative for back pain. Skin: Negative  for rash. Neurological: Negative for headaches, focal weakness or numbness.   ____________________________________________   PHYSICAL EXAM:  VITAL SIGNS: ED Triage Vitals  Enc Vitals Group     BP 10/05/16 1348 116/74     Pulse Rate 10/05/16 1348 88     Resp 10/05/16 1348 20     Temp 10/05/16 1348 98.2 F (36.8 C)     Temp Source 10/05/16 1348 Oral     SpO2 10/05/16 1348 100 %      Weight 10/05/16 1350 191 lb (86.6 kg)     Height 10/05/16 1350  (1.6 m)     Head Circumference --      Peak Flow --      Pain Score 10/05/16 1348 8     Pain Loc --      Pain Edu? --      Excl. in GC? --     Constitutional: alert and oriented 4 tearful and uncomfortable appearing nontoxic no diaphoresis speaks Folk[place] Eyes: PERRL EOMI. Head: Atraumatic. Nose: No congestion/rhinnorhea. Mouth/Throat: No trismus Neck: No stridor.   Cardiovascular: Normal rate, regular rhythm. Grossly normal heart sounds.  Good peripheral circulation. Respiratory: Normal respiratory effort.  No retractions. Lungs CTAB and moving good air Gastrointestinal:  soft nondistended mild diffuse tenderness with no focality no rebound or guarding no peritonitisRectal exam chaperoned by female nurse Fleet Contras: No obvious fissures noted some external hemorrhoids extreme discomfort with digital rectal exam and no feces felt in the vault think Musculoskeletal: No lower extremity edema   Neurologic:  Normal speech and language. No gross focal neurologic deficits are appreciated. Skin:  Skin is warm, dry and intact. No rash noted. Psychiatric: Mood and affect are normal. Speech and behavior are normal.    ____________________________________________   DIFFERENTIAL includes but not limited to  infectious proctitis, inflammatory proctitis, constipation, diarrhea ____________________________________________   LABS (all labs ordered are listed, but only abnormal results are displayed)  Labs Reviewed  COMPREHENSIVE METABOLIC PANEL - Abnormal; Notable for the following:       Result Value   Glucose, Bld 107 (*)    All other components within normal limits  URINALYSIS, COMPLETE (UACMP) WITH MICROSCOPIC - Abnormal; Notable for the following:    Color, Urine YELLOW (*)    APPearance HAZY (*)    Protein, ur 30 (*)    Bacteria, UA RARE (*)    Squamous Epithelial / LPF 6-30 (*)    All other components within normal  limits  LIPASE, BLOOD  CBC    blood work reviewed and interpreted by me unremarkable __________________________________________  EKG   ____________________________________________  RADIOLOGY   ____________________________________________   PROCEDURES  Procedure(s) performed: no  Procedures  Critical Care performed: no  Observation: no ____________________________________________   INITIAL IMPRESSION / ASSESSMENT AND PLAN / ED COURSE  Pertinent labs & imaging results that were available during my care of the patient were reviewed by me and considered in my medical decision making (see chart for details).  On arrival the patient's extremely uncomfortable appearing. She had a CT scan 3 days ago which showed proctitis. It is being treated for presumptive bacterial proctitis. She received ceftriaxone in the emergency department which would cover gonorrhea and she is being treated with doxycycline which will cover Chlamydia. After her pain is adequately controlled I will evaluate her further.     ----------------------------------------- 5:43 PM on 10/05/2016 -----------------------------------------  The patient's pain is markedly improved. Unclear for proctitis is infectious versus inflammatory. I discussed with the  patient a trial of antibiotics already prescribed and follow-up with gastroenterology and she agrees with the plan. Her follow-up is in 1 month, however we will try to help her make an appointment sooner than that. The patient understands that if her pain is not markedly improved in 2-3 more days that she should come back to the emergency department for more urgent GI evaluation. ____________________________________________   FINAL CLINICAL IMPRESSION(S) / ED DIAGNOSES  Final diagnoses:  Proctitis      NEW MEDICATIONS STARTED DURING THIS VISIT:  Discharge Medication List as of 10/05/2016  5:42 PM    START taking these medications   Details    oxyCODONE-acetaminophen (ROXICET) 5-325 MG tablet Take 1 tablet by mouth every 6 (six) hours as needed for severe pain., Starting Mon 10/05/2016, Print         Note:  This document was prepared using Dragon voice recognition software and may include unintentional dictation errors.     Merrily Brittle, MD 10/05/16 [phone]

## 2016-10-08 DIAGNOSIS — K5901 Slow transit constipation: Secondary | ICD-10-CM | POA: Insufficient documentation

## 2016-11-02 ENCOUNTER — Ambulatory Visit: Payer: BLUE CROSS/BLUE SHIELD | Admitting: Gastroenterology

## 2017-11-15 ENCOUNTER — Ambulatory Visit (INDEPENDENT_AMBULATORY_CARE_PROVIDER_SITE_OTHER): Payer: BLUE CROSS/BLUE SHIELD | Admitting: Family Medicine

## 2017-11-15 ENCOUNTER — Encounter: Payer: Self-pay | Admitting: Family Medicine

## 2017-11-15 VITALS — BP 140/90 | HR 64 | Ht 63.0 in | Wt 170.0 lb

## 2017-11-15 DIAGNOSIS — F419 Anxiety disorder, unspecified: Secondary | ICD-10-CM | POA: Diagnosis not present

## 2017-11-15 DIAGNOSIS — Z7689 Persons encountering health services in other specified circumstances: Secondary | ICD-10-CM

## 2017-11-15 DIAGNOSIS — F329 Major depressive disorder, single episode, unspecified: Secondary | ICD-10-CM

## 2017-11-15 DIAGNOSIS — I10 Essential (primary) hypertension: Secondary | ICD-10-CM

## 2017-11-15 DIAGNOSIS — F17210 Nicotine dependence, cigarettes, uncomplicated: Secondary | ICD-10-CM

## 2017-11-15 DIAGNOSIS — M5481 Occipital neuralgia: Secondary | ICD-10-CM

## 2017-11-15 MED ORDER — GABAPENTIN 100 MG PO CAPS: 100.0000 mg | ORAL_CAPSULE | Freq: Four times a day (QID) | ORAL | 2 refills | Status: DC

## 2017-11-15 MED ORDER — CHLORTHALIDONE 25 MG PO TABS: 25.0000 mg | ORAL_TABLET | Freq: Every day | ORAL | 1 refills | Status: DC

## 2017-11-15 NOTE — Patient Instructions (Addendum)
Generalized Anxiety Disorder, Adult Generalized anxiety disorder (GAD) is a mental health disorder. People with this condition constantly worry about everyday events. Unlike normal anxiety, worry related to GAD is not triggered by a specific event. These worries also do not fade or get better with time. GAD interferes with life functions, including relationships, work, and school. GAD can vary from mild to severe. People with severe GAD can have intense waves of anxiety with physical symptoms (panic attacks). What are the causes? The exact cause of GAD is not known. What increases the risk? This condition is more likely to develop in:  Women.  People who have a family history of anxiety disorders.  People who are very shy.  People who experience very stressful life events, such as the death of a loved one.  People who have a very stressful family environment.  What are the signs or symptoms? People with GAD often worry excessively about many things in their lives, such as their health and family. They may also be overly concerned about:  Doing well at work.  Being on time.  Natural disasters.  Friendships.  Physical symptoms of GAD include:  Fatigue.  Muscle tension or having muscle twitches.  Trembling or feeling shaky.  Being easily startled.  Feeling like your heart is pounding or racing.  Feeling out of breath or like you cannot take a deep breath.  Having trouble falling asleep or staying asleep.  Sweating.  Nausea, diarrhea, or irritable bowel syndrome (IBS).  Headaches.  Trouble concentrating or remembering facts.  Restlessness.  Irritability.  How is this diagnosed? Your health care provider can diagnose GAD based on your symptoms and medical history. You will also have a physical exam. The health care provider will ask specific questions about your symptoms, including how severe they are, when they started, and if they come and go. Your health care  provider may ask you about your use of alcohol or drugs, including prescription medicines. Your health care provider may refer you to a mental health specialist for further evaluation. Your health care provider will do a thorough examination and may perform additional tests to rule out other possible causes of your symptoms. To be diagnosed with GAD, a person must have anxiety that:  Is out of his or her control.  Affects several different aspects of his or her life, such as work and relationships.  Causes distress that makes him or her unable to take part in normal activities.  Includes at least three physical symptoms of GAD, such as restlessness, fatigue, trouble concentrating, irritability, muscle tension, or sleep problems.  Before your health care provider can confirm a diagnosis of GAD, these symptoms must be present more days than they are not, and they must last for six months or longer. How is this treated? The following therapies are usually used to treat GAD:  Medicine. Antidepressant medicine is usually prescribed for long-term daily control. Antianxiety medicines may be added in severe cases, especially when panic attacks occur.  Talk therapy (psychotherapy). Certain types of talk therapy can be helpful in treating GAD by providing support, education, and guidance. Options include: ? Cognitive behavioral therapy (CBT). People learn coping skills and techniques to ease their anxiety. They learn to identify unrealistic or negative thoughts and behaviors and to replace them with positive ones. ? Acceptance and commitment therapy (ACT). This treatment teaches people how to be mindful as a way to cope with unwanted thoughts and feelings. ? Biofeedback. This process trains you to   manage your body's response (physiological response) through breathing techniques and relaxation methods. You will work with a therapist while machines are used to monitor your physical symptoms.  Stress  management techniques. These include yoga, meditation, and exercise.  A mental health specialist can help determine which treatment is best for you. Some people see improvement with one type of therapy. However, other people require a combination of therapies. Follow these instructions at home:  Take over-the-counter and prescription medicines only as told by your health care provider.  Try to maintain a normal routine.  Try to anticipate stressful situations and allow extra time to manage them.  Practice any stress management or self-calming techniques as taught by your health care provider.  Do not punish yourself for setbacks or for not making progress.  Try to recognize your accomplishments, even if they are small.  Keep all follow-up visits as told by your health care provider. This is important. Contact a health care provider if:  Your symptoms do not get better.  Your symptoms get worse.  You have signs of depression, such as: ? A persistently sad, cranky, or irritable mood. ? Loss of enjoyment in activities that used to bring you joy. ? Change in weight or eating. ? Changes in sleeping habits. ? Avoiding friends or family members. ? Loss of energy for normal tasks. ? Feelings of guilt or worthlessness. Get help right away if:  You have serious thoughts about hurting yourself or others. If you ever feel like you may hurt yourself or others, or have thoughts about taking your own life, get help right away. You can go to your nearest emergency department or call:  Your local emergency services (911 in the [place]  A suicide crisis helpline, such as the National Suicide Prevention Lifeline at 1-800-273-8255. This is open 24 hours a day.  Summary  Generalized anxiety disorder (GAD) is a mental health disorder that involves worry that is not triggered by a specific event.  People with GAD often worry excessively about many things in their lives, such as their health and  family.  GAD may cause physical symptoms such as restlessness, trouble concentrating, sleep problems, frequent sweating, nausea, diarrhea, headaches, and trembling or muscle twitching.  A mental health specialist can help determine which treatment is best for you. Some people see improvement with one type of therapy. However, other people require a combination of therapies. This information is not intended to replace advice given to you by your health care provider. Make sure you discuss any questions you have with your health care provider. Document Released: 04/25/2012 Document Revised: 11/19/2015 Document Reviewed: 11/19/2015 Elsevier Interactive Patient Education  2018 [employer] Coping with Quitting Smoking Quitting smoking is a physical and mental challenge. You will face cravings, withdrawal symptoms, and temptation. Before quitting, work with your health care provider to make a plan that can help you cope. Preparation can help you quit and keep you from giving in. How can I cope with cravings? Cravings usually last for 5-10 minutes. If you get through it, the craving will pass. Consider taking the following actions to help you cope with cravings:  Keep your mouth busy: ? Chew sugar-free gum. ? Suck on hard candies or a straw. ? Brush your teeth.  Keep your hands and body busy: ? Immediately change to a different activity when you feel a craving. ? Squeeze or play with a ball. ? Do an activity or a hobby, like making bead jewelry, practicing needlepoint, or working   with wood. ? Mix up your normal routine. ? Take a short exercise break. Go for a quick walk or run up and down stairs. ? Spend time in public places where smoking is not allowed.  Focus on doing something kind or helpful for someone else.  Call a friend or family member to talk during a craving.  Join a support group.  Call a quit line, such as 1-800-QUIT-NOW.  Talk with your health care provider about  medicines that might help you cope with cravings and make quitting easier for you.  How can I deal with withdrawal symptoms? Your body may experience negative effects as it tries to get used to not having nicotine in the system. These effects are called withdrawal symptoms. They may include:  Feeling hungrier than normal.  Trouble concentrating.  Irritability.  Trouble sleeping.  Feeling depressed.  Restlessness and agitation.  Craving a cigarette.  To manage withdrawal symptoms:  Avoid places, people, and activities that trigger your cravings.  Remember why you want to quit.  Get plenty of sleep.  Avoid coffee and other caffeinated drinks. These may worsen some of your symptoms.  How can I handle social situations? Social situations can be difficult when you are quitting smoking, especially in the first few weeks. To manage this, you can:  Avoid parties, bars, and other social situations where people might be smoking.  Avoid alcohol.  Leave right away if you have the urge to smoke.  Explain to your family and friends that you are quitting smoking. Ask for understanding and support.  Plan activities with friends or family where smoking is not an option.  What are some ways I can cope with stress? Wanting to smoke may cause stress, and stress can make you want to smoke. Find ways to manage your stress. Relaxation techniques can help. For example:  Breathe slowly and deeply, in through your nose and out through your mouth.  Listen to soothing, relaxing music.  Talk with a family member or friend about your stress.  Light a candle.  Soak in a bath or take a shower.  Think about a peaceful place.  What are some ways I can prevent weight gain? Be aware that many people gain weight after they quit smoking. However, not everyone does. To keep from gaining weight, have a plan in place before you quit and stick to the plan after you quit. Your plan should  include:  Having healthy snacks. When you have a craving, it may help to: ? Eat plain popcorn, crunchy carrots, celery, or other cut vegetables. ? Chew sugar-free gum.  Changing how you eat: ? Eat small portion sizes at meals. ? Eat 4-6 small meals throughout the day instead of 1-2 large meals a day. ? Be mindful when you eat. Do not watch television or do other things that might distract you as you eat.  Exercising regularly: ? Make time to exercise each day. If you do not have time for a long workout, do short bouts of exercise for 5-10 minutes several times a day. ? Do some form of strengthening exercise, like weight lifting, and some form of aerobic exercise, like running or swimming.  Drinking plenty of water or other low-calorie or no-calorie drinks. Drink 6-8 glasses of water daily, or as much as instructed by your health care provider.  Summary  Quitting smoking is a physical and mental challenge. You will face cravings, withdrawal symptoms, and temptation to smoke again. Preparation can help you as   you go through these challenges.  You can cope with cravings by keeping your mouth busy (such as by chewing gum), keeping your body and hands busy, and making calls to family, friends, or a helpline for people who want to quit smoking.  You can cope with withdrawal symptoms by avoiding places where people smoke, avoiding drinks with caffeine, and getting plenty of rest.  Ask your health care provider about the different ways to prevent weight gain, avoid stress, and handle social situations. This information is not intended to replace advice given to you by your health care provider. Make sure you discuss any questions you have with your health care provider. Document Released: 12/27/2015 Document Revised: 12/27/2015 Document Reviewed: 12/27/2015 Elsevier Interactive Patient Education  2018 [employer]  

## 2017-11-15 NOTE — Progress Notes (Signed)
Date:  11/15/2017   Name:  Felicia Garcia   DOB:  1975-04-29   MRN:  [id number]   Chief Complaint: Establish Care ("emotions are everywhere"); Depression (PHQ9=23/ GAD7=19); Flu Vaccine (wants to try to take it); and Hypertension Depression       The patient presents with depression.  This is a recurrent problem.  The current episode started more than 1 year ago.   The problem occurs intermittently.  The problem has been waxing and waning since onset.  Associated symptoms include decreased concentration, helplessness, hopelessness, insomnia, irritable, restlessness, decreased interest and sad.  Associated symptoms include no fatigue, no appetite change, no body aches, no myalgias, no headaches and no suicidal ideas.     The symptoms are aggravated by work stress.  Past treatments include nothing.  Compliance with treatment is variable.  Previous treatment provided moderate relief.  Risk factors include stress.   Past medical history includes anxiety and depression.   Hypertension  This is a chronic problem. The current episode started more than 1 year ago. The problem has been waxing and waning since onset. The problem is controlled. Associated symptoms include anxiety. Pertinent negatives include no chest pain, headaches, palpitations or shortness of breath. There are no known risk factors for coronary artery disease. Past treatments include diuretics. There is no history of angina, kidney disease, CAD/MI, CVA, heart failure, left ventricular hypertrophy, PVD or retinopathy. There is no history of chronic renal disease, a hypertension causing med or renovascular disease.     Review of Systems  Constitutional: Negative.  Negative for appetite change, chills, fatigue, fever and unexpected weight change.  HENT: Negative for congestion, ear discharge, ear pain, rhinorrhea, sinus pressure, sneezing and sore throat.   Eyes: Negative for photophobia, pain, discharge, redness and itching.  Respiratory:  Negative for cough, shortness of breath, wheezing and stridor.   Cardiovascular: Negative for chest pain and palpitations.  Gastrointestinal: Negative for abdominal pain, blood in stool, constipation, diarrhea, nausea and vomiting.  Endocrine: Negative for cold intolerance, heat intolerance, polydipsia, polyphagia and polyuria.  Genitourinary: Negative for dysuria, flank pain, frequency, hematuria, menstrual problem, pelvic pain, urgency, vaginal bleeding and vaginal discharge.  Musculoskeletal: Negative for arthralgias, back pain and myalgias.  Skin: Negative for rash.  Allergic/Immunologic: Negative for environmental allergies and food allergies.  Neurological: Negative for dizziness, weakness, light-headedness, numbness and headaches.  Hematological: Negative for adenopathy. Does not bruise/bleed easily.  Psychiatric/Behavioral: Positive for decreased concentration and depression. Negative for dysphoric mood and suicidal ideas. The patient has insomnia. The patient is not nervous/anxious.     Patient Active Problem List   Diagnosis Date Noted  . Slow transit constipation 10/08/2016  . Anxiety attack 04/15/2015  . History of adenomatous polyp of colon 04/15/2015  . Hyperlipidemia, mixed 01/21/2015  . Orthopnea 01/18/2015  . Atypical chest pain 01/17/2015  . Essential (primary) hypertension 07/31/2014  . Occipital neuralgia 07/31/2014  . Nicotine dependence, uncomplicated 07/31/2014    Allergies  Allergen Reactions  . Flu Virus Vaccine Other (See Comments)    Pain, stiffness and area swollen  . Betadine [Povidone Iodine] Rash  . Povidone-Iodine Rash    rash     Past Surgical History:  Procedure Laterality Date  . TUBAL LIGATION  2002    Social History   Tobacco Use  . Smoking status: Current Every Day Smoker    Packs/day: 1.50    Types: Cigarettes  . Smokeless tobacco: Never Used  Substance Use Topics  . Alcohol use: Yes  Alcohol/week: 2.0 standard drinks     Types: 2 Standard drinks or equivalent per week    Comment: 3-4 beers/day  . Drug use: Never     Medication list has been reviewed and updated.  Current Meds  Medication Sig  . gabapentin (NEURONTIN) 100 MG capsule Take 1 capsule by mouth 4 (four) times daily.  Marland Kitchen ibuprofen (ADVIL,MOTRIN) 600 MG tablet Take 1 tablet (600 mg total) by mouth every 8 (eight) hours as needed.  Marland Kitchen oxyCODONE-acetaminophen (ROXICET) 5-325 MG tablet Take 1 tablet by mouth every 6 (six) hours as needed for severe pain. (Patient taking differently: Take 1 tablet by mouth every 6 (six) hours as needed for severe pain. hosp)  . pravastatin (PRAVACHOL) 40 MG tablet Take 1 tablet by mouth daily.  . promethazine (PHENERGAN) 12.5 MG tablet Take 1 tablet (12.5 mg total) by mouth every 6 (six) hours as needed for nausea or vomiting.  . traMADol (ULTRAM) 50 MG tablet Take 1 tablet (50 mg total) by mouth every 6 (six) hours as needed for moderate pain.    PHQ 2/9 Scores 11/15/2017  PHQ - 2 Score 6  PHQ- 9 Score 23    Physical Exam  Constitutional: She is oriented to person, place, and time. She appears well-developed and well-nourished. She is irritable.  HENT:  Head: Normocephalic.  Right Ear: External ear normal.  Left Ear: External ear normal.  Mouth/Throat: Oropharynx is clear and moist.  Eyes: Pupils are equal, round, and reactive to light. Conjunctivae and EOM are normal. Lids are everted and swept, no foreign bodies found. Left eye exhibits no hordeolum. No foreign body present in the left eye. Right conjunctiva is not injected. Left conjunctiva is not injected. No scleral icterus.  Neck: Normal range of motion. Neck supple. No JVD present. No tracheal deviation present. No thyromegaly present.  Cardiovascular: Normal rate, regular rhythm, normal heart sounds and intact distal pulses. Exam reveals no gallop and no friction rub.  No murmur heard. Pulmonary/Chest: Effort normal and breath sounds normal. No  respiratory distress. She has no wheezes. She has no rales.  Abdominal: Soft. Bowel sounds are normal. She exhibits no mass. There is no hepatosplenomegaly. There is no tenderness. There is no rebound and no guarding.  Musculoskeletal: Normal range of motion. She exhibits no edema or tenderness.  Lymphadenopathy:    She has no cervical adenopathy.  Neurological: She is alert and oriented to person, place, and time. She has normal strength. She displays normal reflexes. No cranial nerve deficit.  Skin: Skin is warm. No rash noted.  Psychiatric: She has a normal mood and affect. Her mood appears not anxious. She does not exhibit a depressed mood.  Nursing note and vitals reviewed.   BP 140/90   Pulse 64   Ht 5\' 3"  (1.6 m)   Wt 170 lb (77.1 kg)   BMI 30.11 kg/m   Assessment and Plan:  1. Establishing care with new doctor, encounter for Patient presents to establish care with new primary care physician. Recheck in 4 weeks for PAP smear.  2. Essential (primary) hypertension Chronic Uncontrolled resume chlorthalidone 25 mh daily- chlorthalidone (HYGROTON) 25 MG tablet; Take 1 tablet (25 mg total) by mouth daily.  Dispense: 30 tablet; Refill: 1  3. Anxiety and depression PHQ 23 and GAD 19. Somewhat denial but now wishes to establish with behavior health. - Ambulatory referral to Psychiatry  4. Cigarette nicotine dependence without complication Chronic Uncontrolled. Information to handle stress and smoking cessation given.  5. Occipital neuralgia of left side Patient desires refill of gabapentin 100 mg qid.    Dr. Hayden [facility]  11/15/2017

## 2017-11-18 ENCOUNTER — Telehealth: Payer: Self-pay

## 2017-11-18 NOTE — Telephone Encounter (Signed)
Pt called saying that she has an appt with Silas Flood on Dec 5th- scheduled.

## 2017-12-13 ENCOUNTER — Ambulatory Visit (INDEPENDENT_AMBULATORY_CARE_PROVIDER_SITE_OTHER): Payer: BLUE CROSS/BLUE SHIELD | Admitting: Family Medicine

## 2017-12-13 ENCOUNTER — Encounter: Payer: Self-pay | Admitting: Family Medicine

## 2017-12-13 VITALS — BP 128/70 | HR 80 | Ht 63.5 in | Wt 168.0 lb

## 2017-12-13 DIAGNOSIS — I10 Essential (primary) hypertension: Secondary | ICD-10-CM

## 2017-12-13 DIAGNOSIS — F17210 Nicotine dependence, cigarettes, uncomplicated: Secondary | ICD-10-CM | POA: Diagnosis not present

## 2017-12-13 MED ORDER — CHLORTHALIDONE 25 MG PO TABS: 25.0000 mg | ORAL_TABLET | Freq: Every day | ORAL | 1 refills | Status: DC

## 2017-12-13 NOTE — Patient Instructions (Signed)
Coping with Quitting Smoking Quitting smoking is a physical and mental challenge. You will face cravings, withdrawal symptoms, and temptation. Before quitting, work with your health care provider to make a plan that can help you cope. Preparation can help you quit and keep you from giving in. How can I cope with cravings? Cravings usually last for 5-10 minutes. If you get through it, the craving will pass. Consider taking the following actions to help you cope with cravings:  Keep your mouth busy: ? Chew sugar-free gum. ? Suck on hard candies or a straw. ? Brush your teeth.  Keep your hands and body busy: ? Immediately change to a different activity when you feel a craving. ? Squeeze or play with a ball. ? Do an activity or a hobby, like making bead jewelry, practicing needlepoint, or working with wood. ? Mix up your normal routine. ? Take a short exercise break. Go for a quick walk or run up and down stairs. ? Spend time in public places where smoking is not allowed.  Focus on doing something kind or helpful for someone else.  Call a friend or family member to talk during a craving.  Join a support group.  Call a quit line, such as 1-800-QUIT-NOW.  Talk with your health care provider about medicines that might help you cope with cravings and make quitting easier for you.  How can I deal with withdrawal symptoms? Your body may experience negative effects as it tries to get used to not having nicotine in the system. These effects are called withdrawal symptoms. They may include:  Feeling hungrier than normal.  Trouble concentrating.  Irritability.  Trouble sleeping.  Feeling depressed.  Restlessness and agitation.  Craving a cigarette.  To manage withdrawal symptoms:  Avoid places, people, and activities that trigger your cravings.  Remember why you want to quit.  Get plenty of sleep.  Avoid coffee and other caffeinated drinks. These may worsen some of your  symptoms.  How can I handle social situations? Social situations can be difficult when you are quitting smoking, especially in the first few weeks. To manage this, you can:  Avoid parties, bars, and other social situations where people might be smoking.  Avoid alcohol.  Leave right away if you have the urge to smoke.  Explain to your family and friends that you are quitting smoking. Ask for understanding and support.  Plan activities with friends or family where smoking is not an option.  What are some ways I can cope with stress? Wanting to smoke may cause stress, and stress can make you want to smoke. Find ways to manage your stress. Relaxation techniques can help. For example:  Breathe slowly and deeply, in through your nose and out through your mouth.  Listen to soothing, relaxing music.  Talk with a family member or friend about your stress.  Light a candle.  Soak in a bath or take a shower.  Think about a peaceful place.  What are some ways I can prevent weight gain? Be aware that many people gain weight after they quit smoking. However, not everyone does. To keep from gaining weight, have a plan in place before you quit and stick to the plan after you quit. Your plan should include:  Having healthy snacks. When you have a craving, it may help to: ? Eat plain popcorn, crunchy carrots, celery, or other cut vegetables. ? Chew sugar-free gum.  Changing how you eat: ? Eat small portion sizes at meals. ?   Eat 4-6 small meals throughout the day instead of 1-2 large meals a day. ? Be mindful when you eat. Do not watch television or do other things that might distract you as you eat.  Exercising regularly: ? Make time to exercise each day. If you do not have time for a long workout, do short bouts of exercise for 5-10 minutes several times a day. ? Do some form of strengthening exercise, like weight lifting, and some form of aerobic exercise, like running or  swimming.  Drinking plenty of water or other low-calorie or no-calorie drinks. Drink 6-8 glasses of water daily, or as much as instructed by your health care provider.  Summary  Quitting smoking is a physical and mental challenge. You will face cravings, withdrawal symptoms, and temptation to smoke again. Preparation can help you as you go through these challenges.  You can cope with cravings by keeping your mouth busy (such as by chewing gum), keeping your body and hands busy, and making calls to family, friends, or a helpline for people who want to quit smoking.  You can cope with withdrawal symptoms by avoiding places where people smoke, avoiding drinks with caffeine, and getting plenty of rest.  Ask your health care provider about the different ways to prevent weight gain, avoid stress, and handle social situations. This information is not intended to replace advice given to you by your health care provider. Make sure you discuss any questions you have with your health care provider. Document Released: 12/27/2015 Document Revised: 12/27/2015 Document Reviewed: 12/27/2015 Elsevier Interactive Patient Education  2018 [employer]  

## 2017-12-13 NOTE — Progress Notes (Signed)
Date:  12/13/2017   Name:  Felicia Garcia   DOB:  [birth year]   MRN:  [id number]   Chief Complaint: Follow-up (depression and blood pressure) and Depression (PHQ=23 has appt with psych on the 5th of this month) Depression         This is a chronic problem.  The current episode started more than 1 year ago.   The onset quality is gradual.   The problem occurs intermittently.  The problem has been waxing and waning since onset.  Associated symptoms include decreased concentration, fatigue, helplessness, hopelessness, irritable, restlessness, decreased interest and sad.  Associated symptoms include does not have insomnia, no appetite change, no body aches, no myalgias, no headaches, no indigestion and no suicidal ideas.  Past treatments include psychotherapy.  Compliance with treatment is good.  Previous treatment provided mild relief.  Past medical history includes anxiety.   Hypertension  This is a chronic problem. The current episode started more than 1 year ago. The problem is unchanged. The problem is controlled. Associated symptoms include anxiety. Pertinent negatives include no blurred vision, chest pain, headaches, malaise/fatigue, neck pain, orthopnea, palpitations, peripheral edema, PND, shortness of breath or sweats. There are no associated agents to hypertension. Past treatments include diuretics. The current treatment provides mild improvement. There are no compliance problems.      Review of Systems  Constitutional: Positive for fatigue. Negative for appetite change, chills, fever, malaise/fatigue and unexpected weight change.  HENT: Negative for congestion, ear discharge, ear pain, rhinorrhea, sinus pressure, sneezing and sore throat.   Eyes: Negative for blurred vision, photophobia, pain, discharge, redness and itching.  Respiratory: Negative for cough, shortness of breath, wheezing and stridor.   Cardiovascular: Negative for chest pain, palpitations, orthopnea and PND.    Gastrointestinal: Negative for abdominal pain, blood in stool, constipation, diarrhea, nausea and vomiting.  Endocrine: Negative for cold intolerance, heat intolerance, polydipsia, polyphagia and polyuria.  Genitourinary: Negative for dysuria, flank pain, frequency, hematuria, menstrual problem, pelvic pain, urgency, vaginal bleeding and vaginal discharge.  Musculoskeletal: Negative for arthralgias, back pain, myalgias and neck pain.  Skin: Negative for rash.  Allergic/Immunologic: Negative for environmental allergies and food allergies.  Neurological: Negative for dizziness, weakness, light-headedness, numbness and headaches.  Hematological: Negative for adenopathy. Does not bruise/bleed easily.  Psychiatric/Behavioral: Positive for decreased concentration and depression. Negative for dysphoric mood and suicidal ideas. The patient is not nervous/anxious and does not have insomnia.     Patient Active Problem List   Diagnosis Date Noted  . Slow transit constipation 10/08/2016  . Anxiety attack 04/15/2015  . History of adenomatous polyp of colon 04/15/2015  . Hyperlipidemia, mixed 01/21/2015  . Orthopnea 01/18/2015  . Atypical chest pain 01/17/2015  . Essential (primary) hypertension 07/31/2014  . Occipital neuralgia 07/31/2014  . Nicotine dependence, uncomplicated 07/31/2014    Allergies  Allergen Reactions  . Flu Virus Vaccine Other (See Comments)    Pain, stiffness and area swollen  . Betadine [Povidone Iodine] Rash  . Povidone-Iodine Rash    rash     Past Surgical History:  Procedure Laterality Date  . TUBAL LIGATION  2002    Social History   Tobacco Use  . Smoking status: Current Every Day Smoker    Packs/day: 1.50    Types: Cigarettes  . Smokeless tobacco: Never Used  Substance Use Topics  . Alcohol use: Yes    Alcohol/week: 2.0 standard drinks    Types: 2 Standard drinks or equivalent per week    Comment:  3-4 beers/day  . Drug use: Never     Medication  list has been reviewed and updated.  Current Meds  Medication Sig  . chlorthalidone (HYGROTON) 25 MG tablet Take 1 tablet (25 mg total) by mouth daily.  Marland Kitchen. gabapentin (NEURONTIN) 100 MG capsule Take 1 capsule (100 mg total) by mouth 4 (four) times daily.  Marland Kitchen. ibuprofen (ADVIL,MOTRIN) 600 MG tablet Take 1 tablet (600 mg total) by mouth every 8 (eight) hours as needed.  . [DISCONTINUED] losartan (COZAAR) 100 MG tablet Take 1 tablet by mouth daily.    PHQ 2/9 Scores 12/13/2017 11/15/2017  PHQ - 2 Score 6 6  PHQ- 9 Score 23 23    Physical Exam  Constitutional: She is oriented to person, place, and time. She appears well-developed and well-nourished. She is irritable.  HENT:  Head: Normocephalic.  Right Ear: External ear normal.  Left Ear: External ear normal.  Mouth/Throat: Oropharynx is clear and moist.  Eyes: Pupils are equal, round, and reactive to light. Conjunctivae and EOM are normal. Lids are everted and swept, no foreign bodies found. Left eye exhibits no hordeolum. No foreign body present in the left eye. Right conjunctiva is not injected. Left conjunctiva is not injected. No scleral icterus.  Neck: Normal range of motion. Neck supple. No JVD present. No tracheal deviation present. No thyromegaly present.  Cardiovascular: Normal rate, regular rhythm, normal heart sounds and intact distal pulses. Exam reveals no gallop and no friction rub.  No murmur heard. Pulmonary/Chest: Effort normal and breath sounds normal. No respiratory distress. She has no wheezes. She has no rales.  Abdominal: Soft. Bowel sounds are normal. She exhibits no mass. There is no hepatosplenomegaly. There is no tenderness. There is no rebound and no guarding.  Musculoskeletal: Normal range of motion. She exhibits no edema or tenderness.  Lymphadenopathy:    She has no cervical adenopathy.  Neurological: She is alert and oriented to person, place, and time. She has normal strength. She displays normal reflexes. No  cranial nerve deficit.  Skin: Skin is warm. No rash noted.  Psychiatric: She has a normal mood and affect. Her mood appears not anxious. She does not exhibit a depressed mood.  Nursing note and vitals reviewed.   BP 128/70   Pulse 80   Ht 5' 3.5" (1.613 m)   Wt 168 lb (76.2 kg)   BMI 29.29 kg/m   Assessment and Plan:  1. Cigarette nicotine dependence without complication Chronic.  Discussed concerns of smoking and ways and means of coping with them.Patient has been advised of the health risks of smoking and counseled concerning cessation of tobacco products. I spent over 3 minutes for discussion and to answer questions.  2. Essential (primary) hypertension Chronic.  Controlled.  Last visit patient's losartan was discontinued and chlorthalidone 25 mg was continued.  Blood pressure was noted to be normal today and therefore chlorthalidone was continued at 25 mg a day.  Renal function panel was obtained on the diuretic. - chlorthalidone (HYGROTON) 25 MG tablet; Take 1 tablet (25 mg total) by mouth daily.  Dispense: 90 tablet; Refill: 1 - Renal Function Panel    Dr. Elizabeth Sauereanna Daric Kore[facility]  12/13/2017

## 2017-12-14 LAB — RENAL FUNCTION PANEL
ALBUMIN: 4.4 g/dL (ref 3.5–5.5)
BUN / CREAT RATIO: 7 — AB (ref 9–23)
BUN: 8 mg/dL (ref 6–24)
CALCIUM: 9.3 mg/dL (ref 8.7–10.2)
CHLORIDE: 101 mmol/L (ref 96–106)
CO2: 24 mmol/L (ref 20–29)
Creatinine, Ser: 1.08 mg/dL — ABNORMAL HIGH (ref 0.57–1.00)
GFR calc non Af Amer: 63 mL/min/{1.73_m2} (ref >59)
GFR, EST AFRICAN AMERICAN: 73 mL/min/{1.73_m2} (ref >59)
GLUCOSE: 100 mg/dL — AB (ref 65–99)
POTASSIUM: 4.1 mmol/L (ref 3.5–5.2)
Phosphorus: 2.4 mg/dL — ABNORMAL LOW (ref 2.5–4.5)
Sodium: 141 mmol/L (ref 134–144)

## 2017-12-27 ENCOUNTER — Other Ambulatory Visit: Payer: Self-pay

## 2017-12-27 ENCOUNTER — Encounter: Payer: Self-pay | Admitting: Emergency Medicine

## 2017-12-27 ENCOUNTER — Emergency Department
Admission: EM | Admit: 2017-12-27 | Discharge: 2017-12-27 | Disposition: A | Discharge status: Home / Self Care | Payer: BLUE CROSS/BLUE SHIELD | Attending: Emergency Medicine | Admitting: Emergency Medicine

## 2017-12-27 ENCOUNTER — Emergency Department: Payer: BLUE CROSS/BLUE SHIELD

## 2017-12-27 ENCOUNTER — Telehealth: Payer: Self-pay

## 2017-12-27 DIAGNOSIS — Z79899 Other long term (current) drug therapy: Secondary | ICD-10-CM | POA: Insufficient documentation

## 2017-12-27 DIAGNOSIS — K5901 Slow transit constipation: Secondary | ICD-10-CM | POA: Diagnosis not present

## 2017-12-27 DIAGNOSIS — K6289 Other specified diseases of anus and rectum: Secondary | ICD-10-CM | POA: Diagnosis present

## 2017-12-27 DIAGNOSIS — F17210 Nicotine dependence, cigarettes, uncomplicated: Secondary | ICD-10-CM | POA: Insufficient documentation

## 2017-12-27 MED ORDER — POLYETHYLENE GLYCOL 3350 17 GM/SCOOP PO POWD: 17.0000 g | Freq: Every day | ORAL | 1 refills | Status: AC

## 2017-12-27 MED ORDER — MAGNESIUM CITRATE PO SOLN
1.0000 | Freq: Once | ORAL | Status: AC
  Administered 2017-12-27: 1 via ORAL
  Filled 2017-12-27: qty 296

## 2017-12-27 MED ORDER — LIDOCAINE HCL URETHRAL/MUCOSAL 2 % EX GEL
1.0000 "application " | Freq: Once | CUTANEOUS | Status: AC
  Administered 2017-12-27: 1

## 2017-12-27 MED ORDER — SORBITOL 70 % SOLN
960.0000 mL | TOPICAL_OIL | Freq: Once | ORAL | Status: AC
  Administered 2017-12-27: 960 mL via RECTAL
  Filled 2017-12-27: qty 473

## 2017-12-27 MED ORDER — POLYETHYLENE GLYCOL 3350 17 G PO PACK
17.0000 g | PACK | Freq: Every day | ORAL | Status: DC
  Administered 2017-12-27: 17 g via ORAL
  Filled 2017-12-27: qty 1

## 2017-12-27 MED ORDER — LIDOCAINE HCL URETHRAL/MUCOSAL 2 % EX GEL
CUTANEOUS | Status: AC
  Administered 2017-12-27: 1
  Filled 2017-12-27: qty 10

## 2017-12-27 MED ORDER — TRAMADOL HCL 50 MG PO TABS
100.0000 mg | ORAL_TABLET | Freq: Once | ORAL | Status: AC
  Administered 2017-12-27: 100 mg via ORAL
  Filled 2017-12-27: qty 2

## 2017-12-27 MED ORDER — TRAMADOL HCL 50 MG PO TABS: 50.0000 mg | ORAL_TABLET | Freq: Three times a day (TID) | ORAL | 0 refills | Status: AC | PRN

## 2017-12-27 MED ORDER — HYDROCORTISONE ACETATE 25 MG RE SUPP: 25.0000 mg | Freq: Two times a day (BID) | RECTAL | 1 refills | Status: AC

## 2017-12-27 MED ORDER — HYDROCORTISONE ACETATE 25 MG RE SUPP
25.0000 mg | Freq: Once | RECTAL | Status: AC
  Administered 2017-12-27: 25 mg via RECTAL
  Filled 2017-12-27: qty 1

## 2017-12-27 MED ORDER — DIBUCAINE 1 % RE OINT
TOPICAL_OINTMENT | Freq: Once | RECTAL | Status: AC
  Administered 2017-12-27: 15:00:00 via RECTAL
  Filled 2017-12-27: qty 28

## 2017-12-27 NOTE — ED Triage Notes (Signed)
Sent here by dr Markham Jordanelliot for possible prolapsed rectum.

## 2017-12-27 NOTE — ED Triage Notes (Signed)
States took laxative Friday and passed large BM and has rectal pain since. Denies any tissue protruding from rectum. States rectum is swollen and painful.

## 2017-12-27 NOTE — Telephone Encounter (Signed)
Pt called in stating that she has an "internal hemorrhoid that feels like it is going to fall out when I stand up". She has tried [employer]itz baths, otc hemorrhoid cream and a liquid diet to keep from having hard stools. Is there anything else she can do? Spoke with Dr Yetta BarreJones and we went over her Hx with rectal/GI issues. We could see that she has seen [facility] and has been to the ER for such issues last year. Told her to give GI a call to get appt because it could be a thrombosed hemorrhoid or rectal prolapse, etc. Pt said she would call them

## 2017-12-27 NOTE — ED Notes (Signed)
Patient presents to the ED with rectal pain and occasional bleeding since Friday.  Patient states she called her GI doctor and was instructed to come to the ED due to history with polyps and hemorrhoids.  Patient states noting bright red blood when she wipes and it drips a little in the toilet.  Patient states, "it looks like thick blood clots or tissue."

## 2017-12-27 NOTE — Discharge Instructions (Signed)
You are being treated for constipation. Use the meds as directed. Increase fiber intake and fluid intake. Follow-up with your provider or return as needed.

## 2017-12-28 NOTE — ED Provider Notes (Signed)
[facility] Emergency Department Provider Note ____________________________________________  Time seen: 1205  I have reviewed the triage vital signs and the nursing notes.  HISTORY  Chief Complaint  Rectal Pain  HPI Felicia Garcia is a 42 y.o. female presents to the ED accompanied by her husband and mother, for evaluation of rectal pain.  She has a history of slow transit constipation, and had routine colonoscopies performed by Dr. Mechele Collin.  She called his office to notify that she was having some pain and discomfort at the rectum as well as some bright red blood per rectum.  He suggested that she report to the ED for evaluation for possible rectal prolapse.  She denies any fullness, active hemorrhoids, or concern for prolapse at this time.  She has passed small amounts of hard firm stool after using a suppository.  She denies any abdominal pain or cramping, bloating, or vomiting at this time.  Past Medical History:  Diagnosis Date  . Hyperlipidemia   . Hypertension   . Occipital neuralgia   . Occipital neuralgia   . Torticollis     Patient Active Problem List   Diagnosis Date Noted  . Slow transit constipation 10/08/2016  . Anxiety attack 04/15/2015  . History of adenomatous polyp of colon 04/15/2015  . Hyperlipidemia, mixed 01/21/2015  . Orthopnea 01/18/2015  . Atypical chest pain 01/17/2015  . Essential (primary) hypertension 07/31/2014  . Occipital neuralgia 07/31/2014  . Nicotine dependence, uncomplicated 07/31/2014    Past Surgical History:  Procedure Laterality Date  . TUBAL LIGATION  2002    Prior to Admission medications   Medication Sig Start Date End Date Taking? Authorizing Provider  chlorthalidone (HYGROTON) 25 MG tablet Take 1 tablet (25 mg total) by mouth daily. 12/13/17   Duanne Limerick, MD  gabapentin (NEURONTIN) 100 MG capsule Take 1 capsule (100 mg total) by mouth 4 (four) times daily. 11/15/17   Duanne Limerick, MD  hydrocortisone  (ANUSOL-HC) 25 MG suppository Place 1 suppository (25 mg total) rectally every 12 (twelve) hours for 12 days. 12/27/17 01/08/18  Wardell Pokorski, Charlesetta Ivory, PA-C  ibuprofen (ADVIL,MOTRIN) 600 MG tablet Take 1 tablet (600 mg total) by mouth every 8 (eight) hours as needed. 10/05/16   Merrily Brittle, MD  polyethylene glycol powder (GLYCOLAX) powder Take 17 g by mouth daily. 12/27/17 01/26/18  Donnisha Besecker, Charlesetta Ivory, PA-C  pravastatin (PRAVACHOL) 40 MG tablet Take 1 tablet by mouth daily. 11/21/15   [provider]  traMADol (ULTRAM) 50 MG tablet Take 1 tablet (50 mg total) by mouth 3 (three) times daily as needed for up to 15 days. 12/27/17 01/11/18  Fatimah Sundquist, Charlesetta Ivory, PA-C    Allergies Flu virus vaccine; Betadine [povidone iodine]; and Povidone-iodine  Family History  Problem Relation Age of Onset  . Hypertension Mother   . Hypertension Sister   . Diabetes Sister     Social History Social History   Tobacco Use  . Smoking status: Current Every Day Smoker    Packs/day: 1.50    Types: Cigarettes  . Smokeless tobacco: Never Used  Substance Use Topics  . Alcohol use: Yes    Alcohol/week: 2.0 standard drinks    Types: 2 Standard drinks or equivalent per week    Comment: 3-4 beers/day  . Drug use: Never    Review of Systems  Constitutional: Negative for fever. Cardiovascular: Negative for chest pain. Respiratory: Negative for shortness of breath. Gastrointestinal: Negative for abdominal pain, vomiting and diarrhea. Reports rectal pain  and constipation as above.  Genitourinary: Negative for dysuria. Musculoskeletal: Negative for back pain. Skin: Negative for rash. Neurological: Negative for headaches, focal weakness or numbness. ____________________________________________  PHYSICAL EXAM:  VITAL SIGNS: ED Triage Vitals  Enc Vitals Group     BP 12/27/17 1055 (!) 122/94     Pulse Rate 12/27/17 1055 96     Resp 12/27/17 1055 18     Temp 12/27/17 1055 97.8 F (36.6  C)     Temp Source 12/27/17 1055 Oral     SpO2 12/27/17 1055 99 %     Weight 12/27/17 1056 173 lb (78.5 kg)     Height 12/27/17 1056 5\' 3"  (1.6 m)     Head Circumference --      Peak Flow --      Pain Score 12/27/17 1115 10     Pain Loc --      Pain Edu? --      Excl. in GC? --     Constitutional: Alert and oriented. Well appearing and in no distress. Head: Normocephalic and atraumatic. Eyes: Conjunctivae are normal. Normal extraocular movements Cardiovascular: Normal rate, regular rhythm. Normal distal pulses. Respiratory: Normal respiratory effort. No wheezes/rales/rhonchi. Gastrointestinal: Soft and nontender. No distention, rebound or guarding. Normal bowel sounds noted. No external hemorrhoids. No rectal prolapse on inspection.  Musculoskeletal: Nontender with normal range of motion in all extremities.  Neurologic:  Normal gait without ataxia. Normal speech and language. No gross focal neurologic deficits are appreciated. Skin:  Skin is warm, dry and intact. No rash noted. Psychiatric: Mood and affect are normal. Patient exhibits appropriate insight and judgment. ____________________________________________   LABS (pertinent positives/negatives) Labs Reviewed - No data to display ____________________________________________   RADIOLOGY  ABD 1 V IMPRESSION: Moderate amount of rectal and proximal colonic stool ____________________________________________  PROCEDURES  Procedures Ultram 100 mg PO Mag Citrate + 17 g PEG  Lidocaine 2% jelly PR Hydrocortisone supp 25 mg PR SMOG enema - (patient administered at home) ____________________________________________  INITIAL IMPRESSION / ASSESSMENT AND PLAN / ED COURSE  Patient with ED evaluation of rectal pain and bleeding, concern for impaction.  Her exam is consistent with constipation without signs of obstruction or impaction.  Patient reports improvement of her symptoms prior to discharge from the ED.  She has opted to  take the enema home to administer herself.  She is also discharged with instructions for Anusol suppositories, PEG powder, and Ultram for moderate pain.  She will follow-up with Dr. Mechele CollinElliott for ongoing symptoms or return to the ED as needed.  I reviewed the patient's prescription history over the last 12 months in the multi-state controlled substances database(s) that includes [place], [place], [place], [place], [place], [place], [place], [place], [place], [place], [place], [place], and [place]  Results were notable for no current prescriptions ____________________________________________  FINAL CLINICAL IMPRESSION(S) / ED DIAGNOSES  Final diagnoses:  Constipation by delayed colonic transit      Karmen StabsMenshew, Charlesetta IvoryJenise V Bacon, PA-C 12/28/17 1907    Don PerkingVeronese, [place], MD 12/30/17 [phone]

## 2018-03-31 ENCOUNTER — Other Ambulatory Visit: Payer: Self-pay

## 2018-03-31 ENCOUNTER — Ambulatory Visit (INDEPENDENT_AMBULATORY_CARE_PROVIDER_SITE_OTHER): Payer: BLUE CROSS/BLUE SHIELD | Admitting: Family Medicine

## 2018-03-31 ENCOUNTER — Encounter: Payer: Self-pay | Admitting: Family Medicine

## 2018-03-31 VITALS — BP 132/74 | HR 105 | Temp 98.5°F | Ht 63.0 in | Wt 178.0 lb

## 2018-03-31 DIAGNOSIS — F17210 Nicotine dependence, cigarettes, uncomplicated: Secondary | ICD-10-CM

## 2018-03-31 DIAGNOSIS — E785 Hyperlipidemia, unspecified: Secondary | ICD-10-CM

## 2018-03-31 DIAGNOSIS — I10 Essential (primary) hypertension: Secondary | ICD-10-CM

## 2018-03-31 DIAGNOSIS — R6883 Chills (without fever): Secondary | ICD-10-CM | POA: Diagnosis not present

## 2018-03-31 DIAGNOSIS — R0789 Other chest pain: Secondary | ICD-10-CM

## 2018-03-31 LAB — POCT INFLUENZA A/B
INFLUENZA B, POC: NEGATIVE
Influenza A, POC: NEGATIVE

## 2018-03-31 MED ORDER — PRAVASTATIN SODIUM 40 MG PO TABS: 40.0000 mg | ORAL_TABLET | Freq: Every day | ORAL | 6 refills | Status: DC

## 2018-03-31 NOTE — Patient Instructions (Signed)
Coping with Quitting Smoking  Quitting smoking is a physical and mental challenge. You will face cravings, withdrawal symptoms, and temptation. Before quitting, work with your health care provider to make a plan that can help you cope. Preparation can help you quit and keep you from giving in. How can I cope with cravings? Cravings usually last for 5-10 minutes. If you get through it, the craving will pass. Consider taking the following actions to help you cope with cravings:  Keep your mouth busy: ? Chew sugar-free gum. ? Suck on hard candies or a straw. ? Brush your teeth.  Keep your hands and body busy: ? Immediately change to a different activity when you feel a craving. ? Squeeze or play with a ball. ? Do an activity or a hobby, like making bead jewelry, practicing needlepoint, or working with wood. ? Mix up your normal routine. ? Take a short exercise break. Go for a quick walk or run up and down stairs. ? Spend time in public places where smoking is not allowed.  Focus on doing something kind or helpful for someone else.  Call a friend or family member to talk during a craving.  Join a support group.  Call a quit line, such as 1-800-QUIT-NOW.  Talk with your health care provider about medicines that might help you cope with cravings and make quitting easier for you. How can I deal with withdrawal symptoms? Your body may experience negative effects as it tries to get used to not having nicotine in the system. These effects are called withdrawal symptoms. They may include:  Feeling hungrier than normal.  Trouble concentrating.  Irritability.  Trouble sleeping.  Feeling depressed.  Restlessness and agitation.  Craving a cigarette. To manage withdrawal symptoms:  Avoid places, people, and activities that trigger your cravings.  Remember why you want to quit.  Get plenty of sleep.  Avoid coffee and other caffeinated drinks. These may worsen some of your symptoms.  How can I handle social situations? Social situations can be difficult when you are quitting smoking, especially in the first few weeks. To manage this, you can:  Avoid parties, bars, and other social situations where people might be smoking.  Avoid alcohol.  Leave right away if you have the urge to smoke.  Explain to your family and friends that you are quitting smoking. Ask for understanding and support.  Plan activities with friends or family where smoking is not an option. What are some ways I can cope with stress? Wanting to smoke may cause stress, and stress can make you want to smoke. Find ways to manage your stress. Relaxation techniques can help. For example:  Breathe slowly and deeply, in through your nose and out through your mouth.  Listen to soothing, relaxing music.  Talk with a family member or friend about your stress.  Light a candle.  Soak in a bath or take a shower.  Think about a peaceful place. What are some ways I can prevent weight gain? Be aware that many people gain weight after they quit smoking. However, not everyone does. To keep from gaining weight, have a plan in place before you quit and stick to the plan after you quit. Your plan should include:  Having healthy snacks. When you have a craving, it may help to: ? Eat plain popcorn, crunchy carrots, celery, or other cut vegetables. ? Chew sugar-free gum.  Changing how you eat: ? Eat small portion sizes at meals. ? Eat 4-6 small meals   throughout the day instead of 1-2 large meals a day. ? Be mindful when you eat. Do not watch television or do other things that might distract you as you eat.  Exercising regularly: ? Make time to exercise each day. If you do not have time for a long workout, do short bouts of exercise for 5-10 minutes several times a day. ? Do some form of strengthening exercise, like weight lifting, and some form of aerobic exercise, like running or swimming.  Drinking plenty of  water or other low-calorie or no-calorie drinks. Drink 6-8 glasses of water daily, or as much as instructed by your health care provider. Summary  Quitting smoking is a physical and mental challenge. You will face cravings, withdrawal symptoms, and temptation to smoke again. Preparation can help you as you go through these challenges.  You can cope with cravings by keeping your mouth busy (such as by chewing gum), keeping your body and hands busy, and making calls to family, friends, or a helpline for people who want to quit smoking.  You can cope with withdrawal symptoms by avoiding places where people smoke, avoiding drinks with caffeine, and getting plenty of rest.  Ask your health care provider about the different ways to prevent weight gain, avoid stress, and handle social situations. This information is not intended to replace advice given to you by your health care provider. Make sure you discuss any questions you have with your health care provider. Document Released: 12/27/2015 Document Revised: 12/27/2015 Document Reviewed: 12/27/2015 Elsevier Interactive Patient Education  2019 Elsevier Inc.  

## 2018-03-31 NOTE — [place] Notes (Signed)
Date:  03/31/2018   Name:  Felicia Garcia   DOB:  [birth year]   MRN:  [id number]   Chief Complaint: Generalized Body Aches (neck down to back hurts, chills) and Hypertension (took clonidine yesterday with her regular b/p med, dropped down "really low but then came back up")  Hypertension  This is a chronic problem. The current episode started more than 1 year ago. The problem has been waxing and waning since onset. The problem is controlled. Associated symptoms include chest pain. Pertinent negatives include no anxiety, blurred vision, headaches, malaise/fatigue, neck pain, orthopnea, palpitations, peripheral edema, PND, shortness of breath or sweats. There are no associated agents to hypertension. There are no known risk factors for coronary artery disease. Past treatments include diuretics. The current treatment provides moderate improvement. There are no compliance problems.  There is no history of angina, kidney disease, CAD/MI, CVA, heart failure, left ventricular hypertrophy, PVD or retinopathy. There is no history of chronic renal disease, a hypertension causing med or renovascular disease.    Review of Systems  Constitutional: Positive for fatigue. Negative for chills, diaphoresis, fever, malaise/fatigue and unexpected weight change.  HENT: Negative for congestion, ear discharge, ear pain, rhinorrhea, sinus pressure, sneezing and sore throat.   Eyes: Negative for blurred vision, photophobia, pain, discharge, redness and itching.  Respiratory: Positive for chest tightness. Negative for cough, choking, shortness of breath, wheezing and stridor.   Cardiovascular: Positive for chest pain. Negative for palpitations, orthopnea, leg swelling and PND.  Gastrointestinal: Negative for abdominal pain, blood in stool, constipation, diarrhea, nausea and vomiting.  Endocrine: Negative for cold intolerance, heat intolerance, polydipsia, polyphagia and polyuria.  Genitourinary: Negative for dysuria,  flank pain, frequency, hematuria, menstrual problem, pelvic pain, urgency, vaginal bleeding and vaginal discharge.  Musculoskeletal: Positive for myalgias. Negative for arthralgias, back pain and neck pain.  Skin: Negative for rash.  Allergic/Immunologic: Negative for environmental allergies and food allergies.  Neurological: Negative for dizziness, weakness, light-headedness, numbness and headaches.  Hematological: Negative for adenopathy. Does not bruise/bleed easily.  Psychiatric/Behavioral: Negative for dysphoric mood. The patient is not nervous/anxious.     Patient Active Problem List   Diagnosis Date Noted  . Slow transit constipation 10/08/2016  . Anxiety attack 04/15/2015  . History of adenomatous polyp of colon 04/15/2015  . Hyperlipidemia, mixed 01/21/2015  . Orthopnea 01/18/2015  . Atypical chest pain 01/17/2015  . Essential (primary) hypertension 07/31/2014  . Occipital neuralgia 07/31/2014  . Nicotine dependence, uncomplicated 07/31/2014    Allergies  Allergen Reactions  . Flu Virus Vaccine Other (See Comments)    Pain, stiffness and area swollen  . Betadine [Povidone Iodine] Rash  . Povidone-Iodine Rash    rash     Past Surgical History:  Procedure Laterality Date  . TUBAL LIGATION  2002    Social History   Tobacco Use  . Smoking status: Current Every Day Smoker    Packs/day: 1.50    Types: Cigarettes  . Smokeless tobacco: Never Used  Substance Use Topics  . Alcohol use: Yes    Alcohol/week: 2.0 standard drinks    Types: 2 Standard drinks or equivalent per week    Comment: 3-4 beers/day  . Drug use: Never     Medication list has been reviewed and updated.  Current Meds  Medication Sig  . chlorthalidone (HYGROTON) 25 MG tablet Take 1 tablet (25 mg total) by mouth daily.  . diazepam (VALIUM) 5 MG tablet Take 1 tablet by mouth 2 (two) times daily. porter  .  gabapentin (NEURONTIN) 300 MG capsule Take 1 capsule by mouth 3 (three) times daily. porter   . ibuprofen (ADVIL,MOTRIN) 600 MG tablet Take 1 tablet (600 mg total) by mouth every 8 (eight) hours as needed.  . lamoTRIgine (LAMICTAL) 150 MG tablet Take 1 tablet by mouth daily. porter  . VRAYLAR 4.5 MG CAPS Take 1 capsule by mouth daily. porter  . [DISCONTINUED] cloNIDine (CATAPRES) 0.1 MG tablet Take 0.1 mg by mouth as needed.  . [DISCONTINUED] gabapentin (NEURONTIN) 100 MG capsule Take 1 capsule (100 mg total) by mouth 4 (four) times daily.    PHQ 2/9 Scores 12/13/2017 11/15/2017  PHQ - 2 Score 6 6  PHQ- 9 Score 23 23    Physical Exam Vitals signs and nursing note reviewed.  Constitutional:      General: She is not in acute distress.    Appearance: She is not diaphoretic.  HENT:     Head: Normocephalic and atraumatic.     Right Ear: External ear normal.     Left Ear: External ear normal.     Nose: Nose normal.  Eyes:     General:        Right eye: No discharge.        Left eye: No discharge.     Conjunctiva/sclera: Conjunctivae normal.     Pupils: Pupils are equal, round, and reactive to light.  Neck:     Musculoskeletal: Normal range of motion and neck supple.     Thyroid: No thyromegaly.     Vascular: No JVD.  Cardiovascular:     Rate and Rhythm: Normal rate and regular rhythm.     Heart sounds: Normal heart sounds. No murmur. No friction rub. No gallop.   Pulmonary:     Effort: Pulmonary effort is normal.     Breath sounds: Normal breath sounds.  Abdominal:     General: Bowel sounds are normal.     Palpations: Abdomen is soft. There is no mass.     Tenderness: There is no abdominal tenderness. There is no guarding.  Musculoskeletal: Normal range of motion.  Lymphadenopathy:     Cervical: No cervical adenopathy.  Skin:    General: Skin is warm and dry.  Neurological:     Mental Status: She is alert.     Deep Tendon Reflexes: Reflexes are normal and symmetric.     Wt Readings from Last 3 Encounters:  03/31/18 178 lb (80.7 kg)  12/27/17 173 lb (78.5 kg)   12/13/17 168 lb (76.2 kg)    BP 132/74   Pulse (!) 105   Temp 98.5 F (36.9 C) (Oral)   Ht 5\' 3"  (1.6 m)   Wt 178 lb (80.7 kg)   SpO2 99%   BMI 31.53 kg/m   Assessment and Plan: 1. Chills (without fever) Acute. Sudden onset. Going from hot to cold. Influenza A and B are both negative. - POCT Influenza A/B  2. Essential (primary) hypertension    3. Hyperlipidemia, unspecified hyperlipidemia type Chronic. Patient had stopped her pravastatin. Will resume pravastatin after episode of chest pain and recheck lipids in 4 weeks. - pravastatin (PRAVACHOL) 40 MG tablet; Take 1 tablet (40 mg total) by mouth daily.  Dispense: 30 tablet; Refill: 6  4. Cigarette nicotine dependence without complication Discussed and provided smoking cessation information with patient.   5. Atypical chest pain Patient had an episode of atypical chest pain.  EKG was done. I have reviewed EKG which shows no acute ischemic changes.  Normal  rate normal rhythm, normal intervals.  Comparison to previous EKG dated 04/25/15 month there are no changes in the 2 EKGs. - EKG 12-Lead

## 2018-04-29 ENCOUNTER — Ambulatory Visit: Payer: BLUE CROSS/BLUE SHIELD | Admitting: Family Medicine

## 2018-06-14 ENCOUNTER — Encounter: Payer: Self-pay | Admitting: Family Medicine

## 2018-06-14 ENCOUNTER — Other Ambulatory Visit: Payer: Self-pay

## 2018-06-14 ENCOUNTER — Ambulatory Visit (INDEPENDENT_AMBULATORY_CARE_PROVIDER_SITE_OTHER): Payer: BC Managed Care – PPO | Admitting: Family Medicine

## 2018-06-14 VITALS — BP 120/70 | HR 60 | Ht 63.0 in | Wt 189.0 lb

## 2018-06-14 DIAGNOSIS — E785 Hyperlipidemia, unspecified: Secondary | ICD-10-CM | POA: Diagnosis not present

## 2018-06-14 DIAGNOSIS — I10 Essential (primary) hypertension: Secondary | ICD-10-CM | POA: Diagnosis not present

## 2018-06-14 DIAGNOSIS — F17210 Nicotine dependence, cigarettes, uncomplicated: Secondary | ICD-10-CM

## 2018-06-14 MED ORDER — CHLORTHALIDONE 25 MG PO TABS: 25.0000 mg | ORAL_TABLET | Freq: Every day | ORAL | 1 refills | Status: DC

## 2018-06-14 MED ORDER — NICOTINE 21 MG/24HR TD PT24: 21.0000 mg | MEDICATED_PATCH | Freq: Every day | TRANSDERMAL | 1 refills | Status: DC

## 2018-06-14 MED ORDER — PRAVASTATIN SODIUM 40 MG PO TABS: 40.0000 mg | ORAL_TABLET | Freq: Every day | ORAL | 1 refills | Status: DC

## 2018-06-14 NOTE — Progress Notes (Signed)
Date:  06/14/2018   Name:  Felicia Garcia   DOB:  02-21-1975   MRN:  [id number]   Chief Complaint: Nicotine Dependence (wants patches); Hypertension (refill); and Hyperlipidemia (not fasting)  Nicotine Dependence  Presents for follow-up visit. Symptoms are negative for sore throat. Her urge triggers include company of smokers. The symptoms have been stable. She smokes 1 pack of cigarettes per day.  Hypertension  This is a chronic problem. The current episode started more than 1 year ago. The problem has been gradually improving since onset. The problem is controlled. Pertinent negatives include no anxiety, blurred vision, chest pain, headaches, malaise/fatigue, neck pain, orthopnea, palpitations, peripheral edema, PND or shortness of breath. There are no associated agents to hypertension. The current treatment provides moderate improvement. There are no compliance problems.  There is no history of angina, kidney disease, CAD/MI, CVA, heart failure, left ventricular hypertrophy, PVD or retinopathy. There is no history of chronic renal disease, a hypertension causing med or renovascular disease.  Hyperlipidemia  This is a chronic problem. The current episode started more than 1 year ago. The problem is controlled. Recent lipid tests were reviewed and are normal. She has no history of chronic renal disease, diabetes, hypothyroidism, liver disease, obesity or nephrotic syndrome. Pertinent negatives include no chest pain, myalgias or shortness of breath. Current antihyperlipidemic treatment includes statins.    Review of Systems  Constitutional: Negative for chills, fever and malaise/fatigue.  HENT: Negative for drooling, ear discharge, ear pain and sore throat.   Eyes: Negative for blurred vision.  Respiratory: Negative for cough, shortness of breath and wheezing.   Cardiovascular: Negative for chest pain, palpitations, orthopnea, leg swelling and PND.  Gastrointestinal: Negative for abdominal  pain, blood in stool, constipation, diarrhea and nausea.  Endocrine: Negative for polydipsia.  Genitourinary: Negative for dysuria, frequency, hematuria and urgency.  Musculoskeletal: Negative for back pain, myalgias and neck pain.  Skin: Negative for rash.  Allergic/Immunologic: Negative for environmental allergies.  Neurological: Negative for dizziness and headaches.  Hematological: Does not bruise/bleed easily.  Psychiatric/Behavioral: Negative for suicidal ideas. The patient is not nervous/anxious.     Patient Active Problem List   Diagnosis Date Noted  . Slow transit constipation 10/08/2016  . Anxiety attack 04/15/2015  . History of adenomatous polyp of colon 04/15/2015  . Hyperlipidemia, mixed 01/21/2015  . Orthopnea 01/18/2015  . Atypical chest pain 01/17/2015  . Essential (primary) hypertension 07/31/2014  . Occipital neuralgia 07/31/2014  . Nicotine dependence, uncomplicated 07/31/2014    Allergies  Allergen Reactions  . Flu Virus Vaccine Other (See Comments)    Pain, stiffness and area swollen  . Betadine [Povidone Iodine] Rash  . Povidone-Iodine Rash    rash     Past Surgical History:  Procedure Laterality Date  . TUBAL LIGATION  2002    Social History   Tobacco Use  . Smoking status: Current Every Day Smoker    Packs/day: 1.50    Types: Cigarettes  . Smokeless tobacco: Never Used  Substance Use Topics  . Alcohol use: Yes    Alcohol/week: 2.0 standard drinks    Types: 2 Standard drinks or equivalent per week    Comment: 3-4 beers/day  . Drug use: Never     Medication list has been reviewed and updated.  Current Meds  Medication Sig  . chlorthalidone (HYGROTON) 25 MG tablet Take 1 tablet (25 mg total) by mouth daily.  . diazepam (VALIUM) 5 MG tablet Take 1 tablet by mouth 2 (two) times  daily. porter  . gabapentin (NEURONTIN) 300 MG capsule Take 1 capsule by mouth 4 (four) times daily. porter  . ibuprofen (ADVIL,MOTRIN) 600 MG tablet Take 1  tablet (600 mg total) by mouth every 8 (eight) hours as needed.  . lamoTRIgine (LAMICTAL) 150 MG tablet Take 1 tablet by mouth daily. porter  . pravastatin (PRAVACHOL) 40 MG tablet Take 1 tablet (40 mg total) by mouth daily.  Marland Kitchen REXULTI 2 MG TABS Take 1 tablet by mouth daily. porter    PHQ 2/9 Scores 06/14/2018 12/13/2017 11/15/2017  PHQ - 2 Score 0 6 6  PHQ- 9 Score 0 23 23    BP Readings from Last 3 Encounters:  06/14/18 120/70  03/31/18 132/74  12/27/17 (!) 144/95    Physical Exam Vitals signs and nursing note reviewed.  Constitutional:      Appearance: She is well-developed.  HENT:     Head: Normocephalic.     Right Ear: Tympanic membrane, ear canal and external ear normal.     Left Ear: Tympanic membrane, ear canal and external ear normal.     Nose: Nose normal. No congestion or rhinorrhea.  Eyes:     General: Lids are everted, no foreign bodies appreciated. No scleral icterus.       Left eye: No foreign body or hordeolum.     Conjunctiva/sclera: Conjunctivae normal.     Right eye: Right conjunctiva is not injected.     Left eye: Left conjunctiva is not injected.     Pupils: Pupils are equal, round, and reactive to light.  Neck:     Musculoskeletal: Normal range of motion and neck supple.     Thyroid: No thyromegaly.     Vascular: No JVD.     Trachea: No tracheal deviation.  Cardiovascular:     Rate and Rhythm: Normal rate and regular rhythm.     Heart sounds: Normal heart sounds. No murmur. No friction rub. No gallop.   Pulmonary:     Effort: Pulmonary effort is normal. No respiratory distress.     Breath sounds: Normal breath sounds. No wheezing or rales.  Abdominal:     General: Bowel sounds are normal.     Palpations: Abdomen is soft. There is no mass.     Tenderness: There is no abdominal tenderness. There is no guarding or rebound.  Musculoskeletal: Normal range of motion.        General: No tenderness.  Lymphadenopathy:     Cervical: No cervical adenopathy.   Skin:    General: Skin is warm.     Findings: No rash.  Neurological:     Mental Status: She is alert and oriented to person, place, and time.     Cranial Nerves: No cranial nerve deficit.     Deep Tendon Reflexes: Reflexes normal.  Psychiatric:        Mood and Affect: Mood is not anxious or depressed.     Wt Readings from Last 3 Encounters:  06/14/18 189 lb (85.7 kg)  03/31/18 178 lb (80.7 kg)  12/27/17 173 lb (78.5 kg)    BP 120/70   Pulse 60   Ht  (1.6 m)   Wt 189 lb (85.7 kg)   BMI 33.48 kg/m   Assessment and Plan: 1. Essential (primary) hypertension Chronic.  Controlled.  Continue chlorthalidone 25 mg once a day.  Review of previous labs unremarkable will recheck renal panel on next visit. - chlorthalidone (HYGROTON) 25 MG tablet; Take 1 tablet (25 mg total)  by mouth daily.  Dispense: 90 tablet; Refill: 1  2. Hyperlipidemia, unspecified hyperlipidemia type Chronic.  Controlled.  Will continue Pravachol 40 mg once a day.  Review of lipid panel previous was done and will repeat a lipid panel fasting in the future. - pravastatin (PRAVACHOL) 40 MG tablet; Take 1 tablet (40 mg total) by mouth daily.  Dispense: 90 tablet; Refill: 1  3. Cigarette nicotine dependence without complication Patient has been advised of the health risks of smoking and counseled concerning cessation of tobacco products. I spent over 3 minutes for discussion and to answer questions.  Patient was started on NicoDerm CQ 21 mg per 24-hour patch and was given additional coaching on smoking cessation.  Patient will call for any future de-escalation of the nicotine patches dosage. - nicotine (NICODERM CQ - DOSED IN MG/24 HOURS) 21 mg/24hr patch; Place 1 patch (21 mg total) onto the skin daily.  Dispense: 28 patch; Refill: 1

## 2018-06-21 ENCOUNTER — Telehealth: Payer: BLUE CROSS/BLUE SHIELD | Admitting: Nurse Practitioner

## 2018-06-21 DIAGNOSIS — L247 Irritant contact dermatitis due to plants, except food: Secondary | ICD-10-CM

## 2018-06-21 MED ORDER — PREDNISONE 10 MG (21) PO TBPK: ORAL_TABLET | ORAL | 0 refills | Status: DC

## 2018-06-21 NOTE — Progress Notes (Signed)
E Visit for Rash  We are sorry that you are not feeling well. Here is how we plan to help!  Based on what you shared with me it looks like you have contact dermatitis.  Contact dermatitis is a skin rash caused by something that touches the skin and causes irritation or inflammation.  Your skin may be red, swollen, dry, cracked, and itch.  The rash should go away in a few days but can last a few weeks.  If you get a rash, it's important to figure out what caused it so the irritant can be avoided in the future.   Prednisone 10 mg daily for 6 days (see taper instructions below)  Directions for 6 day taper: Day 1: 2 tablets before breakfast, 1 after both lunch & dinner and 2 at bedtime Day 2: 1 tab before breakfast, 1 after both lunch & dinner and 2 at bedtime Day 3: 1 tab at each meal & 1 at bedtime Day 4: 1 tab at breakfast, 1 at lunch, 1 at bedtime Day 5: 1 tab at breakfast & 1 tab at bedtime Day 6: 1 tab at breakfast      HOME CARE:   Take cool showers and avoid direct sunlight.  Apply cool compress or wet dressings.  Avoid scratching  Take a bath in an oatmeal bath.  Sprinkle content of one Aveeno packet under running faucet with comfortably warm water.  Bathe for 15-20 minutes, 1-2 times daily.  Pat dry with a towel. Do not rub the rash.  Use hydrocortisone cream.  Calamine lotion  Take an antihistamine like Benadryl for widespread rashes that itch.  The adult dose of Benadryl is 25-50 mg by mouth 4 times daily.  Caution:  This type of medication may cause sleepiness.  Do not drink alcohol, drive, or operate dangerous machinery while taking antihistamines.  Do not take these medications if you have prostate enlargement.  Read package instructions thoroughly on all medications that you take.  GET HELP RIGHT AWAY IF:   Symptoms don't go away after treatment.  Severe itching that persists.  If you rash spreads or swells.  If you rash begins to smell.  If it blisters  and opens or develops a yellow-brown crust.  You develop a fever.  You have a sore throat.  You become short of breath.  MAKE SURE YOU:  Understand these instructions. Will watch your condition. Will get help right away if you are not doing well or get worse.  Thank you for choosing an e-visit. Your e-visit answers were reviewed by a board certified advanced clinical practitioner to complete your personal care plan. Depending upon the condition, your plan could have included both over the counter or prescription medications. Please review your pharmacy choice. Be sure that the pharmacy you have chosen is open so that you can pick up your prescription now.  If there is a problem you may message your provider in [place] to have the prescription routed to another pharmacy. Your safety is important to [place]. If you have drug allergies check your prescription carefully.  For the next 24 hours, you can use MyChart to ask questions about today's visit, request a non-urgent call back, or ask for a work or school excuse from your e-visit provider. You will get an email in the next two days asking about your experience. I hope that your e-visit has been valuable and will speed your recovery.    5-10 minutes spent reviewing and documenting in chart.

## 2018-07-13 LAB — HM PAP SMEAR: HM Pap smear: NORMAL

## 2019-01-25 ENCOUNTER — Other Ambulatory Visit: Payer: Self-pay

## 2019-01-25 ENCOUNTER — Ambulatory Visit (INDEPENDENT_AMBULATORY_CARE_PROVIDER_SITE_OTHER): Payer: BC Managed Care – PPO | Admitting: Family Medicine

## 2019-01-25 ENCOUNTER — Encounter: Payer: Self-pay | Admitting: Family Medicine

## 2019-01-25 VITALS — BP 132/86 | HR 76 | Ht 63.0 in | Wt 186.0 lb

## 2019-01-25 DIAGNOSIS — Z1239 Encounter for other screening for malignant neoplasm of breast: Secondary | ICD-10-CM

## 2019-01-25 NOTE — Progress Notes (Signed)
Date:  01/25/2019   Name:  Felicia Garcia   DOB:  August 13, 1975   MRN:  [id number]   Chief Complaint: Breast Mass (L) breast between 6 and 9:00. found it when taking a shower x 4 weeks ago. Some tenderness, no discharge from the nipple)  Patient is a 44 year old female who presents for a breast exam exam. The patient reports the following problems: palpable mass/noted over 4 weeks ago. Health maintenance has been reviewed up to date.   Lab Results  Component Value Date   CREATININE 1.08 (H) 12/13/2017   BUN 8 12/13/2017   NA 141 12/13/2017   K 4.1 12/13/2017   CL 101 12/13/2017   CO2 24 12/13/2017   No results found for: CHOL, HDL, LDLCALC, LDLDIRECT, TRIG, CHOLHDL No results found for: TSH No results found for: HGBA1C   Review of Systems  Constitutional: Negative.  Negative for chills, fatigue, fever and unexpected weight change.  HENT: Negative for congestion, ear discharge, ear pain, rhinorrhea, sinus pressure, sneezing and sore throat.   Eyes: Negative for photophobia, pain, discharge, redness and itching.  Respiratory: Negative for cough, shortness of breath, wheezing and stridor.   Cardiovascular: Negative for chest pain, palpitations and leg swelling.  Gastrointestinal: Negative for abdominal pain, blood in stool, constipation, diarrhea, nausea and vomiting.  Endocrine: Negative for cold intolerance, heat intolerance, polydipsia, polyphagia and polyuria.  Genitourinary: Negative for dysuria, flank pain, frequency, hematuria, menstrual problem, pelvic pain, urgency, vaginal bleeding and vaginal discharge.  Musculoskeletal: Negative for arthralgias, back pain and myalgias.  Skin: Negative for rash.  Allergic/Immunologic: Negative for environmental allergies and food allergies.  Neurological: Negative for dizziness, weakness, light-headedness, numbness and headaches.  Hematological: Negative for adenopathy. Does not bruise/bleed easily.  Psychiatric/Behavioral: Negative for  dysphoric mood. The patient is not nervous/anxious.     Patient Active Problem List   Diagnosis Date Noted  . Slow transit constipation 10/08/2016  . Anxiety attack 04/15/2015  . History of adenomatous polyp of colon 04/15/2015  . Hyperlipidemia, mixed 01/21/2015  . Orthopnea 01/18/2015  . Atypical chest pain 01/17/2015  . Essential (primary) hypertension 07/31/2014  . Occipital neuralgia 07/31/2014  . Nicotine dependence, uncomplicated 07/31/2014    Allergies  Allergen Reactions  . Flu Virus Vaccine Other (See Comments)    Pain, stiffness and area swollen  . Betadine [Povidone Iodine] Rash  . Povidone-Iodine Rash    rash     Past Surgical History:  Procedure Laterality Date  . TUBAL LIGATION  2002    Social History   Tobacco Use  . Smoking status: Current Every Day Smoker    Packs/day: 1.50    Types: Cigarettes  . Smokeless tobacco: Never Used  Substance Use Topics  . Alcohol use: Yes    Alcohol/week: 2.0 standard drinks    Types: 2 Standard drinks or equivalent per week    Comment: 3-4 beers/day  . Drug use: Never     Medication list has been reviewed and updated.  Current Meds  Medication Sig  . chlorthalidone (HYGROTON) 25 MG tablet Take 1 tablet (25 mg total) by mouth daily.  . diazepam (VALIUM) 5 MG tablet Take 1 tablet by mouth 2 (two) times daily. porter  . gabapentin (NEURONTIN) 300 MG capsule Take 1 capsule by mouth 4 (four) times daily. porter  . ibuprofen (ADVIL,MOTRIN) 600 MG tablet Take 1 tablet (600 mg total) by mouth every 8 (eight) hours as needed.  . lamoTRIgine (LAMICTAL) 150 MG tablet Take 1 tablet  by mouth daily. porter  . pravastatin (PRAVACHOL) 40 MG tablet Take 1 tablet (40 mg total) by mouth daily.  . [DISCONTINUED] nicotine (NICODERM CQ - DOSED IN MG/24 HOURS) 21 mg/24hr patch Place 1 patch (21 mg total) onto the skin daily.    PHQ 2/9 Scores 01/25/2019 06/14/2018 12/13/2017 11/15/2017  PHQ - 2 Score 0 0 6 6  PHQ- 9 Score 0 0 23 23     BP Readings from Last 3 Encounters:  01/25/19 132/86  06/14/18 120/70  03/31/18 132/74    Physical Exam Vitals and nursing note reviewed.  Constitutional:      General: She is not in acute distress.    Appearance: She is not diaphoretic.  HENT:     Head: Normocephalic and atraumatic.     Right Ear: External ear normal.     Left Ear: External ear normal.     Nose: Nose normal.  Eyes:     General:        Right eye: No discharge.        Left eye: No discharge.     Conjunctiva/sclera: Conjunctivae normal.     Pupils: Pupils are equal, round, and reactive to light.  Neck:     Thyroid: No thyromegaly.     Vascular: No JVD.  Cardiovascular:     Rate and Rhythm: Normal rate and regular rhythm.     Heart sounds: Normal heart sounds. No murmur. No friction rub. No gallop.   Pulmonary:     Effort: Pulmonary effort is normal.     Breath sounds: Normal breath sounds.  Chest:     Breasts:        Right: Normal. No swelling, bleeding, inverted nipple, mass, nipple discharge, skin change or tenderness.        Left: No swelling, bleeding, inverted nipple, mass, nipple discharge, skin change or tenderness.       Comments: Overlying sebaceous cyst/no underlying mass Abdominal:     General: Bowel sounds are normal.     Palpations: Abdomen is soft. There is no mass.     Tenderness: There is no abdominal tenderness. There is no guarding.  Musculoskeletal:        General: Normal range of motion.     Cervical back: Normal range of motion and neck supple.  Lymphadenopathy:     Cervical: No cervical adenopathy.     Upper Body:     Right upper body: No supraclavicular or axillary adenopathy.     Left upper body: No supraclavicular or axillary adenopathy.  Skin:    General: Skin is warm and dry.  Neurological:     Mental Status: She is alert.     Deep Tendon Reflexes: Reflexes are normal and symmetric.     Wt Readings from Last 3 Encounters:  01/25/19 186 lb (84.4 kg)  06/14/18  189 lb (85.7 kg)  03/31/18 178 lb (80.7 kg)    BP 132/86   Pulse 76   Ht 5\' 3"  (1.6 m)   Wt 186 lb (84.4 kg)   LMP 01/25/2019 (Exact Date)   BMI 32.95 kg/m   Assessment and Plan:  1. Screening breast examination Patient presents today for evaluation of breast for screening purposes.  Patient in the shower noted a little pea-sized area at about 7-8 o'clock on the surface of the left breast.  On evaluation there is a little sebaceous cyst that is seen and this is the area that she can palpate gets underneath the breast that  she cannot see as well this is also noted by the mother and she also feels that it may be a cyst induced sensation.  In the meantime we will get a screening mammogram for purposes of ruling out cancer given that the patient has a history of breast cancer with her grandmother.  Incidentally patient also has a family history of ovarian cancer with her mom and it has been forwarded to patient's GYN physician that this may need to be approached with [facility] screening. - Ambulatory referral to Obstetrics / Gynecology

## 2019-09-22 ENCOUNTER — Other Ambulatory Visit: Payer: Self-pay

## 2019-10-09 ENCOUNTER — Encounter: Payer: Self-pay | Admitting: Family Medicine

## 2019-10-09 ENCOUNTER — Ambulatory Visit (INDEPENDENT_AMBULATORY_CARE_PROVIDER_SITE_OTHER): Payer: BC Managed Care – PPO | Admitting: Family Medicine

## 2019-10-09 ENCOUNTER — Other Ambulatory Visit: Payer: Self-pay

## 2019-10-09 ENCOUNTER — Ambulatory Visit: Payer: Self-pay | Admitting: *Deleted

## 2019-10-09 VITALS — BP 130/80 | HR 80 | Ht 63.0 in | Wt 196.0 lb

## 2019-10-09 DIAGNOSIS — F17210 Nicotine dependence, cigarettes, uncomplicated: Secondary | ICD-10-CM | POA: Diagnosis not present

## 2019-10-09 DIAGNOSIS — B09 Unspecified viral infection characterized by skin and mucous membrane lesions: Secondary | ICD-10-CM | POA: Diagnosis not present

## 2019-10-09 DIAGNOSIS — I10 Essential (primary) hypertension: Secondary | ICD-10-CM | POA: Diagnosis not present

## 2019-10-09 DIAGNOSIS — E785 Hyperlipidemia, unspecified: Secondary | ICD-10-CM

## 2019-10-09 MED ORDER — CHLORTHALIDONE 25 MG PO TABS: 25.0000 mg | ORAL_TABLET | Freq: Every day | ORAL | 1 refills | Status: DC

## 2019-10-09 MED ORDER — PRAVASTATIN SODIUM 40 MG PO TABS: 40.0000 mg | ORAL_TABLET | Freq: Every day | ORAL | 1 refills | Status: DC

## 2019-10-09 NOTE — Telephone Encounter (Signed)
Patient states she has areas of red- almost streaks upper on legs and started appearing on her arm today- now is going away- no pain or swelling. Patient did make appointment and she did take pictures of areas.  Reason for Disposition . Mild localized rash  Answer Assessment - Initial Assessment Questions 1. APPEARANCE of RASH: "Describe the rash."      Almost hives- red 2. LOCATION: "Where is the rash located?"      Legs- L- inner thigh R thigh-outer/front and arm 3. NUMBER: "How many spots are there?"      Smooth redness 4. SIZE: "How big are the spots?" (Inches, centimeters or compare to size of a coin)      Thin like a marker- then thinker at areas 5. ONSET: "When did the rash start?"      1 hour ago 6. ITCHING: "Does the rash itch?" If Yes, ask: "How bad is the itch?"  (Scale 1-10; or mild, moderate, severe)     No itching 7. PAIN: "Does the rash hurt?" If Yes, ask: "How bad is the pain?"  (Scale 1-10; or mild, moderate, severe)     No pain 8. OTHER SYMPTOMS: "Do you have any other symptoms?" (e.g., fever)     no 9. PREGNANCY: "Is there any chance you are pregnant?" "When was your last menstrual period?"     n/a  Protocols used: RASH OR REDNESS - LOCALIZED-A-AH

## 2019-10-09 NOTE — Progress Notes (Addendum)
Date:  10/09/2019   Name:  Felicia Garcia   DOB:  11-07-1975   MRN:  [id number]   Chief Complaint: Rash (spreading when "sitting in sun"- seems to go away when cooling off.), Hypertension, and Hyperlipidemia  Rash This is a new problem. The current episode started today. The problem has been waxing and waning since onset. Location: thighs. The rash is characterized by redness (spreading). She was exposed to nothing. Pertinent negatives include no anorexia, congestion, cough, diarrhea, eye pain, facial edema, fatigue, fever, joint pain, nail changes, rhinorrhea, shortness of breath, sore throat or vomiting.  Hypertension This is a chronic problem. The current episode started more than 1 year ago. The problem is controlled. Pertinent negatives include no chest pain, headaches or shortness of breath. Risk factors for coronary artery disease include dyslipidemia. The current treatment provides mild improvement. There is no history of angina, kidney disease, CAD/MI, CVA, heart failure, left ventricular hypertrophy, PVD or retinopathy. There is no history of chronic renal disease, a hypertension causing med or renovascular disease.  Hyperlipidemia This is a chronic problem. The current episode started more than 1 year ago. The problem is controlled. Recent lipid tests were reviewed and are normal. She has no history of chronic renal disease, diabetes, hypothyroidism, liver disease, obesity or nephrotic syndrome. Pertinent negatives include no chest pain, focal sensory loss, focal weakness, leg pain, myalgias or shortness of breath. The current treatment provides mild improvement of lipids.    Lab Results  Component Value Date   CREATININE 1.08 (H) 12/13/2017   BUN 8 12/13/2017   NA 141 12/13/2017   K 4.1 12/13/2017   CL 101 12/13/2017   CO2 24 12/13/2017   No results found for: CHOL, HDL, LDLCALC, LDLDIRECT, TRIG, CHOLHDL No results found for: TSH No results found for: HGBA1C Lab Results    Component Value Date   WBC 8.3 10/05/2016   HGB 14.4 10/05/2016   HCT 40.3 10/05/2016   MCV 93.1 10/05/2016   PLT 301 10/05/2016   Lab Results  Component Value Date   ALT 15 10/05/2016   AST 15 10/05/2016   ALKPHOS 49 10/05/2016   BILITOT 0.7 10/05/2016     Review of Systems  Constitutional: Negative.  Negative for chills, fatigue, fever and unexpected weight change.  HENT: Negative for congestion, ear discharge, ear pain, rhinorrhea, sinus pressure, sneezing and sore throat.   Eyes: Negative for photophobia, pain, discharge, redness and itching.  Respiratory: Negative for cough, shortness of breath, wheezing and stridor.   Cardiovascular: Negative for chest pain.  Gastrointestinal: Negative for abdominal pain, anorexia, blood in stool, constipation, diarrhea, nausea and vomiting.  Endocrine: Negative for cold intolerance, heat intolerance, polydipsia, polyphagia and polyuria.  Genitourinary: Negative for dysuria, flank pain, frequency, hematuria, menstrual problem, pelvic pain, urgency, vaginal bleeding and vaginal discharge.  Musculoskeletal: Negative for arthralgias, back pain, joint pain and myalgias.  Skin: Positive for rash. Negative for nail changes.  Allergic/Immunologic: Negative for environmental allergies and food allergies.  Neurological: Negative for dizziness, focal weakness, weakness, light-headedness, numbness and headaches.  Hematological: Negative for adenopathy. Does not bruise/bleed easily.  Psychiatric/Behavioral: Negative for dysphoric mood. The patient is not nervous/anxious.     Patient Active Problem List   Diagnosis Date Noted  . Slow transit constipation 10/08/2016  . Anxiety attack 04/15/2015  . History of adenomatous polyp of colon 04/15/2015  . Hyperlipidemia, mixed 01/21/2015  . Orthopnea 01/18/2015  . Atypical chest pain 01/17/2015  . Essential (primary) hypertension 07/31/2014  .  Occipital neuralgia 07/31/2014  . Nicotine dependence,  uncomplicated [id number]    Allergies  Allergen Reactions  . Flu Virus Vaccine Other (See Comments)    Pain, stiffness and area swollen  . Betadine [Povidone Iodine] Rash  . Povidone-Iodine Rash    rash     Past Surgical History:  Procedure Laterality Date  . TUBAL LIGATION  2002    Social History   Tobacco Use  . Smoking status: Current Every Day Smoker    Packs/day: 1.50    Types: Cigarettes  . Smokeless tobacco: Never Used  Substance Use Topics  . Alcohol use: Yes    Alcohol/week: 2.0 standard drinks    Types: 2 Standard drinks or equivalent per week    Comment: 3-4 beers/day  . Drug use: Never     Medication list has been reviewed and updated.  Current Meds  Medication Sig  . chlorthalidone (HYGROTON) 25 MG tablet Take 1 tablet (25 mg total) by mouth daily.  Marland Kitchen escitalopram (LEXAPRO) 10 MG tablet Take 1 tablet by mouth daily. cbc  . gabapentin (NEURONTIN) 300 MG capsule Take 1 capsule by mouth 4 (four) times daily. Porter/ cbc  . ibuprofen (ADVIL,MOTRIN) 600 MG tablet Take 1 tablet (600 mg total) by mouth every 8 (eight) hours as needed.  . pravastatin (PRAVACHOL) 40 MG tablet Take 1 tablet (40 mg total) by mouth daily.    PHQ 2/9 Scores 10/09/2019 01/25/2019 06/14/2018 12/13/2017  PHQ - 2 Score 0 0 0 6  PHQ- 9 Score 0 0 0 23    GAD 7 : Generalized Anxiety Score 10/09/2019 01/25/2019 11/15/2017  Nervous, Anxious, on Edge 0 0 2  Control/stop worrying 0 0 3  Worry too much - different things 0 0 3  Trouble relaxing 0 0 3  Restless 0 0 3  Easily annoyed or irritable 0 0 3  Afraid - awful might happen 0 0 2  Total GAD 7 Score 0 0 19  Anxiety Difficulty - - Extremely difficult    BP Readings from Last 3 Encounters:  10/09/19 130/80  01/25/19 132/86  06/14/18 120/70    Physical Exam Vitals and nursing note reviewed.  Constitutional:      Appearance: She is well-developed.  HENT:     Head: Normocephalic.     Right Ear: External ear normal.     Left Ear:  External ear normal.  Eyes:     General: Lids are everted, no foreign bodies appreciated. No scleral icterus.       Left eye: No foreign body or hordeolum.     Conjunctiva/sclera: Conjunctivae normal.     Right eye: Right conjunctiva is not injected.     Left eye: Left conjunctiva is not injected.     Pupils: Pupils are equal, round, and reactive to light.  Neck:     Thyroid: No thyromegaly.     Vascular: No JVD.     Trachea: No tracheal deviation.  Cardiovascular:     Rate and Rhythm: Normal rate and regular rhythm.     Heart sounds: Normal heart sounds. No murmur heard.  No friction rub. No gallop.   Pulmonary:     Effort: Pulmonary effort is normal. No respiratory distress.     Breath sounds: Normal breath sounds. No wheezing or rales.  Abdominal:     General: Bowel sounds are normal.     Palpations: Abdomen is soft. There is no mass.     Tenderness: There is no abdominal tenderness. There is  no guarding or rebound.  Musculoskeletal:        General: No tenderness. Normal range of motion.     Cervical back: Normal range of motion and neck supple.  Lymphadenopathy:     Cervical: No cervical adenopathy.  Skin:    General: Skin is warm.     Findings: No rash.  Neurological:     Mental Status: She is alert and oriented to person, place, and time.     Cranial Nerves: No cranial nerve deficit.     Deep Tendon Reflexes: Reflexes normal.  Psychiatric:        Mood and Affect: Mood is not anxious or depressed.     Wt Readings from Last 3 Encounters:  10/09/19 196 lb (88.9 kg)  01/25/19 186 lb (84.4 kg)  06/14/18 189 lb (85.7 kg)    BP 130/80   Pulse 80   Ht 5\' 3"  (1.6 m)   Wt 196 lb (88.9 kg)   BMI 34.72 kg/m   Assessment and Plan: 1. Hyperlipidemia, unspecified hyperlipidemia type . Controlled. Stable. Continue pravastatin 40 mg once a day. Will check lipid panel. - pravastatin (PRAVACHOL) 40 MG tablet; Take 1 tablet (40 mg total) by mouth daily.  Dispense: 90 tablet;  Refill: 1  2. Essential (primary) hypertension . Controlled. Stable. Continue chlorthalidone 25 mg once a day. Will check renal function panel. - Renal Function Panel - chlorthalidone (HYGROTON) 25 MG tablet; Take 1 tablet (25 mg total) by mouth daily.  Dispense: 90 tablet; Refill: 1  3. Cigarette nicotine dependence without complication Chronic. Controlled. Stable. Unfortunately patient was unable to quit with NicoDerm patches and Chantix is on backorder. Husband was able to quit but patient has not had the willpower to do so at this time. We have reencouraged her again to do so when patient is giving consideration.  4. Viral exanthem Patient describes a reticular rash that is lacy-like in appearance which may reflect a viral exanthem such as a fifth disease or a pityriasis rosea (patient did not note may be a herald patch on the left ankle) or type rash or erythema multiforme-like eruption given that it changed over time. This is likely short-lived and unlikely to be of concern.  Patient's been informed to return if this should continue over 4 to 6-week.

## 2019-10-10 LAB — RENAL FUNCTION PANEL
Albumin: 4.5 g/dL (ref 3.8–4.8)
BUN/Creatinine Ratio: 20 (ref 9–23)
BUN: 20 mg/dL (ref 6–24)
CO2: 26 mmol/L (ref 20–29)
Calcium: 9.9 mg/dL (ref 8.7–10.2)
Chloride: 105 mmol/L (ref 96–106)
Creatinine, Ser: 1.01 mg/dL — ABNORMAL HIGH (ref 0.57–1.00)
GFR calc Af Amer: 78 mL/min/{1.73_m2} (ref >59)
GFR calc non Af Amer: 67 mL/min/{1.73_m2} (ref >59)
Glucose: 84 mg/dL (ref 65–99)
Phosphorus: 3.5 mg/dL (ref 3.0–4.3)
Potassium: 4.5 mmol/L (ref 3.5–5.2)
Sodium: 142 mmol/L (ref 134–144)

## 2020-01-03 ENCOUNTER — Other Ambulatory Visit: Payer: Self-pay

## 2020-01-03 ENCOUNTER — Encounter: Payer: Self-pay | Admitting: Family Medicine

## 2020-01-03 ENCOUNTER — Ambulatory Visit (INDEPENDENT_AMBULATORY_CARE_PROVIDER_SITE_OTHER): Payer: BC Managed Care – PPO | Admitting: Family Medicine

## 2020-01-03 VITALS — BP 122/80 | HR 102 | Ht 63.0 in | Wt 197.0 lb

## 2020-01-03 DIAGNOSIS — K2900 Acute gastritis without bleeding: Secondary | ICD-10-CM

## 2020-01-03 DIAGNOSIS — R112 Nausea with vomiting, unspecified: Secondary | ICD-10-CM | POA: Diagnosis not present

## 2020-01-03 MED ORDER — ONDANSETRON HCL 4 MG PO TABS: 4.0000 mg | ORAL_TABLET | Freq: Three times a day (TID) | ORAL | 0 refills | Status: DC | PRN

## 2020-01-03 MED ORDER — PANTOPRAZOLE SODIUM 40 MG PO TBEC: 40.0000 mg | DELAYED_RELEASE_TABLET | Freq: Every day | ORAL | 3 refills | Status: AC

## 2020-01-03 NOTE — Progress Notes (Signed)
Date:  01/03/2020   Name:  Felicia Garcia   DOB:  February 28, 1975   MRN:  [id number]   Chief Complaint: Nausea (Nausea and bloating since Saturday. Not eating much because of the nausea. Have vomited several times. Tried stool softeners and baking soda with water. This helped with bloating. )  GI Problem The primary symptoms include nausea. Primary symptoms do not include fever, fatigue, abdominal pain, vomiting, diarrhea, melena, hematemesis, jaundice, hematochezia, dysuria, myalgias, arthralgias or rash. The illness began 3 to 5 days ago. The problem has been gradually worsening.  Nausea began 3 to 5 days ago. The nausea is associated with eating. The nausea is exacerbated by food.  The illness is also significant for bloating. The illness does not include chills, dysphagia, constipation, tenesmus, back pain or itching. Associated medical issues do not include GERD, gallstones or alcohol abuse.    Lab Results  Component Value Date   CREATININE 1.01 (H) 10/09/2019   BUN 20 10/09/2019   NA 142 10/09/2019   K 4.5 10/09/2019   CL 105 10/09/2019   CO2 26 10/09/2019   No results found for: CHOL, HDL, LDLCALC, LDLDIRECT, TRIG, CHOLHDL No results found for: TSH No results found for: HGBA1C Lab Results  Component Value Date   WBC 8.3 10/05/2016   HGB 14.4 10/05/2016   HCT 40.3 10/05/2016   MCV 93.1 10/05/2016   PLT 301 10/05/2016   Lab Results  Component Value Date   ALT 15 10/05/2016   AST 15 10/05/2016   ALKPHOS 49 10/05/2016   BILITOT 0.7 10/05/2016     Review of Systems  Constitutional: Negative.  Negative for chills, fatigue, fever and unexpected weight change.  HENT: Negative for congestion, ear discharge, ear pain, rhinorrhea, sinus pressure, sneezing and sore throat.   Eyes: Negative for double vision, photophobia, pain, discharge, redness and itching.  Respiratory: Negative for cough, shortness of breath, wheezing and stridor.   Gastrointestinal: Positive for bloating  and nausea. Negative for abdominal pain, blood in stool, constipation, diarrhea, dysphagia, hematemesis, hematochezia, jaundice, melena and vomiting.  Endocrine: Negative for cold intolerance, heat intolerance, polydipsia, polyphagia and polyuria.  Genitourinary: Negative for dysuria, flank pain, frequency, hematuria, menstrual problem, pelvic pain, urgency, vaginal bleeding and vaginal discharge.  Musculoskeletal: Negative for arthralgias, back pain and myalgias.  Skin: Negative for itching and rash.  Allergic/Immunologic: Negative for environmental allergies and food allergies.  Neurological: Negative for dizziness, weakness, light-headedness, numbness and headaches.  Hematological: Negative for adenopathy. Does not bruise/bleed easily.  Psychiatric/Behavioral: Negative for dysphoric mood. The patient is not nervous/anxious.     Patient Active Problem List   Diagnosis Date Noted  . Slow transit constipation 10/08/2016  . Anxiety attack 04/15/2015  . History of adenomatous polyp of colon 04/15/2015  . Hyperlipidemia, mixed 01/21/2015  . Orthopnea 01/18/2015  . Atypical chest pain 01/17/2015  . Essential (primary) hypertension 07/31/2014  . Occipital neuralgia 07/31/2014  . Nicotine dependence, uncomplicated 07/31/2014    Allergies  Allergen Reactions  . Influenza Virus Vaccine Other (See Comments)    Pain, stiffness and area swollen  . Betadine [Povidone Iodine] Rash  . Povidone-Iodine Rash    rash     Past Surgical History:  Procedure Laterality Date  . TUBAL LIGATION  2002    Social History   Tobacco Use  . Smoking status: Current Every Day Smoker    Packs/day: 1.50    Types: Cigarettes  . Smokeless tobacco: Never Used  Substance Use Topics  . Alcohol  use: Yes    Alcohol/week: 2.0 standard drinks    Types: 2 Standard drinks or equivalent per week    Comment: 3-4 beers/day  . Drug use: Never     Medication list has been reviewed and updated.  Current Meds   Medication Sig  . chlorthalidone (HYGROTON) 25 MG tablet Take 1 tablet (25 mg total) by mouth daily.  Marland Kitchen escitalopram (LEXAPRO) 10 MG tablet Take 1 tablet by mouth daily. cbc  . gabapentin (NEURONTIN) 300 MG capsule Take 1 capsule by mouth 4 (four) times daily. Porter/ cbc  . ibuprofen (ADVIL,MOTRIN) 600 MG tablet Take 1 tablet (600 mg total) by mouth every 8 (eight) hours as needed.  . pravastatin (PRAVACHOL) 40 MG tablet Take 1 tablet (40 mg total) by mouth daily.    PHQ 2/9 Scores 01/03/2020 01/03/2020 10/09/2019 01/25/2019  PHQ - 2 Score 4 0 0 0  PHQ- 9 Score 7 0 0 0    GAD 7 : Generalized Anxiety Score 01/03/2020 10/09/2019 01/25/2019 11/15/2017  Nervous, Anxious, on Edge 0 0 0 2  Control/stop worrying 0 0 0 3  Worry too much - different things 0 0 0 3  Trouble relaxing 0 0 0 3  Restless 0 0 0 3  Easily annoyed or irritable 0 0 0 3  Afraid - awful might happen 0 0 0 2  Total GAD 7 Score 0 0 0 19  Anxiety Difficulty Not difficult at all - - Extremely difficult    BP Readings from Last 3 Encounters:  01/03/20 122/80  10/09/19 130/80  01/25/19 132/86    Physical Exam Vitals and nursing note reviewed.  Constitutional:      General: She is not in acute distress.    Appearance: She is not diaphoretic.  HENT:     Head: Normocephalic and atraumatic.     Right Ear: Tympanic membrane and external ear normal.     Left Ear: External ear normal.     Nose: Nose normal.     Mouth/Throat:     Lips: Pink.     Mouth: Oropharynx is clear and moist. Mucous membranes are moist.  Eyes:     General:        Right eye: No discharge.        Left eye: No discharge.     Extraocular Movements: EOM normal.     Conjunctiva/sclera: Conjunctivae normal.     Pupils: Pupils are equal, round, and reactive to light.  Neck:     Thyroid: No thyromegaly.     Vascular: No JVD.  Cardiovascular:     Rate and Rhythm: Normal rate and regular rhythm.     Pulses: Intact distal pulses.     Heart sounds:  Normal heart sounds. No murmur heard. No friction rub. No gallop.   Pulmonary:     Effort: Pulmonary effort is normal.     Breath sounds: Normal breath sounds.  Abdominal:     General: Bowel sounds are normal. There is no distension.     Palpations: Abdomen is soft. There is no hepatomegaly, splenomegaly or mass.     Tenderness: There is generalized abdominal tenderness. There is no right CVA tenderness, left CVA tenderness, guarding or rebound.  Musculoskeletal:        General: No edema. Normal range of motion.     Cervical back: Normal range of motion and neck supple.  Lymphadenopathy:     Cervical: No cervical adenopathy.  Skin:    General: Skin is warm  and dry.  Neurological:     Mental Status: She is alert.     Deep Tendon Reflexes: Reflexes are normal and symmetric.     Wt Readings from Last 3 Encounters:  01/03/20 197 lb (89.4 kg)  10/09/19 196 lb (88.9 kg)  01/25/19 186 lb (84.4 kg)    BP 122/80   Pulse (!) 102   Ht 5\' 3"  (1.6 m)   Wt 197 lb (89.4 kg)   LMP 11/30/2019 (Exact Date)   SpO2 98%   BMI 34.90 kg/m   Assessment and Plan: 1. Acute gastritis without hemorrhage, unspecified gastritis type New onset.  Episodic.  Relatively stable.  Patient with the onset since Saturday mild abdominal bloating and discomfort but particularly with nausea and vomiting.  We will initiate some pantoprazole 40 mg once a day for the gastritis.  Patient is to proceed to urgent care or emergency room if pain should become an issue.  We will initiate a GI referral to [facility] clinic if patient is remaining symptomatic on Monday.  Patient's been instructed to have eaten chicken broth with have cut Gatorade for hydration.  2. Non-intractable vomiting with nausea, unspecified vomiting type She with persistent nausea and vomiting for which we will initiate Zofran 4 mg every 4-6 hours as needed nausea.

## 2020-06-06 ENCOUNTER — Other Ambulatory Visit: Payer: Self-pay

## 2020-06-06 ENCOUNTER — Ambulatory Visit (INDEPENDENT_AMBULATORY_CARE_PROVIDER_SITE_OTHER): Payer: Self-pay | Admitting: Family Medicine

## 2020-06-06 ENCOUNTER — Telehealth: Payer: Self-pay

## 2020-06-06 ENCOUNTER — Encounter: Payer: Self-pay | Admitting: Family Medicine

## 2020-06-06 VITALS — BP 162/120 | HR 96 | Ht 63.0 in | Wt 206.0 lb

## 2020-06-06 DIAGNOSIS — L42 Pityriasis rosea: Secondary | ICD-10-CM

## 2020-06-06 NOTE — Telephone Encounter (Signed)
Left voicemail to have patient call back to set up appointment for rash

## 2020-06-06 NOTE — Progress Notes (Signed)
Date:  06/06/2020   Name:  Felicia Garcia   DOB:  1975/10/19   MRN:  [id number]   Chief Complaint: Rash (X2 weeks, all over body, redness, itches sometimes but not a lot, spreading )  Rash This is a chronic problem. The current episode started more than 1 year ago. The problem has been gradually improving since onset. The rash is diffuse. Pertinent negatives include no cough, diarrhea, fever, shortness of breath or sore throat.    Lab Results  Component Value Date   CREATININE 1.01 (H) 10/09/2019   BUN 20 10/09/2019   NA 142 10/09/2019   K 4.5 10/09/2019   CL 105 10/09/2019   CO2 26 10/09/2019   No results found for: CHOL, HDL, LDLCALC, LDLDIRECT, TRIG, CHOLHDL No results found for: TSH No results found for: HGBA1C Lab Results  Component Value Date   WBC 8.3 10/05/2016   HGB 14.4 10/05/2016   HCT 40.3 10/05/2016   MCV 93.1 10/05/2016   PLT 301 10/05/2016   Lab Results  Component Value Date   ALT 15 10/05/2016   AST 15 10/05/2016   ALKPHOS 49 10/05/2016   BILITOT 0.7 10/05/2016     Review of Systems  Constitutional: Negative for chills and fever.  HENT: Negative for drooling, ear discharge, ear pain and sore throat.   Respiratory: Negative for cough, shortness of breath and wheezing.   Cardiovascular: Negative for chest pain, palpitations and leg swelling.  Gastrointestinal: Negative for abdominal pain, blood in stool, constipation, diarrhea and nausea.  Endocrine: Negative for polydipsia.  Genitourinary: Negative for dysuria, frequency, hematuria and urgency.  Musculoskeletal: Negative for back pain, myalgias and neck pain.  Skin: Positive for rash.  Allergic/Immunologic: Negative for environmental allergies.  Neurological: Negative for dizziness and headaches.  Hematological: Does not bruise/bleed easily.  Psychiatric/Behavioral: Negative for suicidal ideas. The patient is not nervous/anxious.     Patient Active Problem List   Diagnosis Date Noted  . Slow  transit constipation 10/08/2016  . Anxiety attack 04/15/2015  . History of adenomatous polyp of colon 04/15/2015  . Hyperlipidemia, mixed 01/21/2015  . Orthopnea 01/18/2015  . Atypical chest pain 01/17/2015  . Essential (primary) hypertension 07/31/2014  . Occipital neuralgia 07/31/2014  . Nicotine dependence, uncomplicated 07/31/2014    Allergies  Allergen Reactions  . Influenza Virus Vaccine Other (See Comments)    Pain, stiffness and area swollen  . Betadine [Povidone Iodine] Rash  . Povidone-Iodine Rash    rash     Past Surgical History:  Procedure Laterality Date  . TUBAL LIGATION  2002    Social History   Tobacco Use  . Smoking status: Current Every Day Smoker    Packs/day: 1.50    Types: Cigarettes  . Smokeless tobacco: Never Used  Substance Use Topics  . Alcohol use: Yes    Alcohol/week: 2.0 standard drinks    Types: 2 Standard drinks or equivalent per week    Comment: 3-4 beers/day  . Drug use: Never     Medication list has been reviewed and updated.  Current Meds  Medication Sig  . ARIPiprazole (ABILIFY) 5 MG tablet Take 15 mg by mouth.  . Ascorbic Acid (VITAMIN C PO) Take by mouth.  . chlorthalidone (HYGROTON) 25 MG tablet Take 1 tablet (25 mg total) by mouth daily.  . Cyanocobalamin (VITAMIN B12 PO) Take by mouth.  . diazepam (VALIUM) 5 MG tablet Take 5 mg by mouth 2 (two) times daily.  Marland Kitchen gabapentin (NEURONTIN) 300 MG capsule  Take 1 capsule by mouth 4 (four) times daily. Porter/ cbc  . ibuprofen (ADVIL,MOTRIN) 600 MG tablet Take 1 tablet (600 mg total) by mouth every 8 (eight) hours as needed.  . lamoTRIgine (LAMICTAL) 25 MG tablet Take by mouth.  . ondansetron (ZOFRAN) 4 MG tablet Take 1 tablet (4 mg total) by mouth every 8 (eight) hours as needed for nausea or vomiting.  . pantoprazole (PROTONIX) 40 MG tablet Take 1 tablet (40 mg total) by mouth daily.  . pravastatin (PRAVACHOL) 40 MG tablet Take 1 tablet (40 mg total) by mouth daily.  .  progesterone (PROMETRIUM) 100 MG capsule Take 100 mg by mouth at bedtime.  . traZODone (DESYREL) 100 MG tablet Take 50-100 mg by mouth at bedtime as needed.  Marland Kitchen VITAMIN D PO Take by mouth.  . [DISCONTINUED] escitalopram (LEXAPRO) 10 MG tablet Take 1 tablet by mouth daily. cbc    PHQ 2/9 Scores 06/06/2020 01/03/2020 01/03/2020 10/09/2019  PHQ - 2 Score 0 4 0 0  PHQ- 9 Score 0 7 0 0    GAD 7 : Generalized Anxiety Score 06/06/2020 01/03/2020 10/09/2019 01/25/2019  Nervous, Anxious, on Edge 0 0 0 0  Control/stop worrying 0 0 0 0  Worry too much - different things 0 0 0 0  Trouble relaxing 0 0 0 0  Restless 0 0 0 0  Easily annoyed or irritable 0 0 0 0  Afraid - awful might happen 0 0 0 0  Total GAD 7 Score 0 0 0 0  Anxiety Difficulty - Not difficult at all - -    BP Readings from Last 3 Encounters:  06/06/20 (!) 162/120  01/03/20 122/80  10/09/19 130/80    Physical Exam Vitals reviewed.  Constitutional:      Appearance: She is well-developed.  HENT:     Head: Normocephalic.     Right Ear: Tympanic membrane, ear canal and external ear normal.     Left Ear: Tympanic membrane, ear canal and external ear normal.  Eyes:     General: Lids are everted, no foreign bodies appreciated. No scleral icterus.       Left eye: No foreign body or hordeolum.     Conjunctiva/sclera: Conjunctivae normal.     Right eye: Right conjunctiva is not injected.     Left eye: Left conjunctiva is not injected.     Pupils: Pupils are equal, round, and reactive to light.  Neck:     Thyroid: No thyromegaly.     Vascular: No JVD.     Trachea: No tracheal deviation.  Cardiovascular:     Rate and Rhythm: Normal rate and regular rhythm.     Heart sounds: Normal heart sounds. No murmur heard. No friction rub. No gallop.   Pulmonary:     Effort: Pulmonary effort is normal. No respiratory distress.     Breath sounds: Normal breath sounds. No wheezing or rales.  Abdominal:     General: Bowel sounds are normal.      Palpations: Abdomen is soft. There is no mass.     Tenderness: There is no abdominal tenderness. There is no guarding or rebound.  Musculoskeletal:        General: No tenderness. Normal range of motion.     Cervical back: Normal range of motion and neck supple.  Lymphadenopathy:     Cervical: No cervical adenopathy.  Skin:    General: Skin is warm.     Findings: Rash present. Rash is macular and scaling.  Neurological:     Mental Status: She is alert and oriented to person, place, and time.     Cranial Nerves: No cranial nerve deficit.     Deep Tendon Reflexes: Reflexes normal.  Psychiatric:        Mood and Affect: Mood is not anxious or depressed.     Wt Readings from Last 3 Encounters:  06/06/20 206 lb (93.4 kg)  01/03/20 197 lb (89.4 kg)  10/09/19 196 lb (88.9 kg)    BP (!) 162/120   Pulse 96   Ht 5\' 3"  (1.6 m)   Wt 206 lb (93.4 kg)   LMP 05/16/2020   SpO2 98%   BMI 36.49 kg/m   Assessment and Plan:  1. Pityriasis rosea New onset.  Persistent.  Patient has a truncal rash of the anterior and posterior.  There is also a scaling rash with some almost ecchymosis surrounding over the lower legs that may be different.  However the rashes on the trunk resembles a pityriasis rosea distribution and patient seems to have recalled a herald area reminiscent of a herald patch.  Patient's been suggested to take loratadine in the morning Benadryl at night we have made referral to dermatology in the event that there is no resolution of the rash. - Ambulatory referral to Dermatology

## 2020-06-06 NOTE — Telephone Encounter (Unsigned)
Copied from CRM [id number]. Topic: General - Inquiry >> Jun 06, 2020  9:20 AM Daphine Deutscher D wrote: Reason for CRM: Felicia Garcia with [facility] called asking to speak to dr. Yetta Barre regarding patient.  Pt had a rash yesterday saying the rash closly resembles the Lupus Rash Pt's mother said she had been diagnosed with Lupus in 1998.  They feel she needs to be seen  and tested for Lupus.  CB#  [id number]  [phone]

## 2020-09-16 ENCOUNTER — Emergency Department
Admission: EM | Admit: 2020-09-16 | Discharge: 2020-09-16 | Disposition: A | Discharge status: Home / Self Care | Payer: 59 | Attending: Emergency Medicine | Admitting: Emergency Medicine

## 2020-09-16 ENCOUNTER — Other Ambulatory Visit: Payer: Self-pay

## 2020-09-16 DIAGNOSIS — R55 Syncope and collapse: Secondary | ICD-10-CM | POA: Diagnosis present

## 2020-09-16 DIAGNOSIS — F17210 Nicotine dependence, cigarettes, uncomplicated: Secondary | ICD-10-CM | POA: Insufficient documentation

## 2020-09-16 DIAGNOSIS — E876 Hypokalemia: Secondary | ICD-10-CM

## 2020-09-16 DIAGNOSIS — Z79899 Other long term (current) drug therapy: Secondary | ICD-10-CM | POA: Diagnosis not present

## 2020-09-16 DIAGNOSIS — I10 Essential (primary) hypertension: Secondary | ICD-10-CM | POA: Diagnosis not present

## 2020-09-16 LAB — COMPREHENSIVE METABOLIC PANEL
ALT: 18 U/L (ref 0–44)
AST: 22 U/L (ref 15–41)
Albumin: 4 g/dL (ref 3.5–5.0)
Alkaline Phosphatase: 56 U/L (ref 38–126)
Anion gap: 6 (ref 5–15)
BUN: 12 mg/dL (ref 6–20)
CO2: 26 mmol/L (ref 22–32)
Calcium: 8.7 mg/dL — ABNORMAL LOW (ref 8.9–10.3)
Chloride: 108 mmol/L (ref 98–111)
Creatinine, Ser: 0.79 mg/dL (ref 0.44–1.00)
GFR, Estimated: >60 mL/min (ref >60)
Glucose, Bld: 123 mg/dL — ABNORMAL HIGH (ref 70–99)
Potassium: 3.2 mmol/L — ABNORMAL LOW (ref 3.5–5.1)
Sodium: 140 mmol/L (ref 135–145)
Total Bilirubin: 0.6 mg/dL (ref 0.3–1.2)
Total Protein: 6.8 g/dL (ref 6.5–8.1)

## 2020-09-16 LAB — URINALYSIS, ROUTINE W REFLEX MICROSCOPIC
Bacteria, UA: NONE SEEN
Bilirubin Urine: NEGATIVE
Glucose, UA: NEGATIVE mg/dL
Ketones, ur: NEGATIVE mg/dL
Leukocytes,Ua: NEGATIVE
Nitrite: NEGATIVE
Protein, ur: NEGATIVE mg/dL
Specific Gravity, Urine: <1.005 — ABNORMAL LOW (ref 1.005–1.030)
pH: 6.5 (ref 5.0–8.0)

## 2020-09-16 LAB — CBC WITH DIFFERENTIAL/PLATELET
Abs Immature Granulocytes: 0.03 10*3/uL (ref 0.00–0.07)
Basophils Absolute: 0.1 10*3/uL (ref 0.0–0.1)
Basophils Relative: 1 %
Eosinophils Absolute: 0.1 10*3/uL (ref 0.0–0.5)
Eosinophils Relative: 1 %
HCT: 39.7 % (ref 36.0–46.0)
Hemoglobin: 14.3 g/dL (ref 12.0–15.0)
Immature Granulocytes: 0 %
Lymphocytes Relative: 22 %
Lymphs Abs: 2.4 10*3/uL (ref 0.7–4.0)
MCH: 35.7 pg — ABNORMAL HIGH (ref 26.0–34.0)
MCHC: 36 g/dL (ref 30.0–36.0)
MCV: 99 fL (ref 80.0–100.0)
Monocytes Absolute: 0.7 10*3/uL (ref 0.1–1.0)
Monocytes Relative: 7 %
Neutro Abs: 7.5 10*3/uL (ref 1.7–7.7)
Neutrophils Relative %: 69 %
Platelets: 295 10*3/uL (ref 150–400)
RBC: 4.01 MIL/uL (ref 3.87–5.11)
RDW: 12.5 % (ref 11.5–15.5)
WBC: 10.9 10*3/uL — ABNORMAL HIGH (ref 4.0–10.5)
nRBC: 0 % (ref 0.0–0.2)

## 2020-09-16 LAB — TROPONIN I (HIGH SENSITIVITY): Troponin I (High Sensitivity): 4 ng/L (ref <18)

## 2020-09-16 LAB — MAGNESIUM: Magnesium: 2.7 mg/dL — ABNORMAL HIGH (ref 1.7–2.4)

## 2020-09-16 MED ORDER — POTASSIUM CHLORIDE CRYS ER 20 MEQ PO TBCR
40.0000 meq | EXTENDED_RELEASE_TABLET | Freq: Once | ORAL | Status: AC
  Administered 2020-09-16: 40 meq via ORAL
  Filled 2020-09-16: qty 2

## 2020-09-16 MED ORDER — SODIUM CHLORIDE 0.9 % IV BOLUS
500.0000 mL | Freq: Once | INTRAVENOUS | Status: AC
  Administered 2020-09-16: 500 mL via INTRAVENOUS

## 2020-09-16 NOTE — ED Notes (Signed)
[place] EDP at bedside

## 2020-09-16 NOTE — Discharge Instructions (Addendum)
Please be careful and do not mix your alcohol with other medications.  This can be very dangerous.  Your lab work was reassuring there is no evidence of any heart problems.  However EKG did show a slightly prolonged QTC which can be due to low potassium.  You should go up on your potassium for 2 days and you should have a recheck with your primary care doctor in 2 days of your potassium and EKG.  Return to the ER if you develop worsening symptoms, recurrent syncope or any other concerns

## 2020-09-16 NOTE — ED Notes (Signed)
Report received from Brandon RN at this time.  

## 2020-09-16 NOTE — ED Provider Notes (Addendum)
[facility] Emergency Department Provider Note  ____________________________________________   Event Date/Time   First MD Initiated Contact with Patient 09/16/20 [phone]     (approximate)  I have reviewed the triage vital signs and the nursing notes.   HISTORY  Chief Complaint Near Syncope (EMS report home call syncope. EMS given 20g RAC, 500 ml. Pt report missing ETOH and home meds, then had hard time standing)    HPI Felicia Garcia is a 45 y.o. female with hypertension, hyperlipidemia who comes in with concerns for syncopal episode.  Patient reports that she was feeling very anxious and had taken some Valium.  She also drank a few shots of alcohol.  Around 10 PM she started having some muscle twitching so she took her gabapentin.  She states that her family was getting into an argument just prior to arrival and when she went to stand up she lost consciousness and fell to the ground.  States that she did not hit her head.  Denies any neck pain.  Denies any pain anywhere else.  States that she feels that she does not need to be here that it was just related to her nerves.  Denies any chest pain, shortness of breath, abdominal pain or any other concerns.  Family later come to the room and stated that they witnessed her fall and that she did not hit her head but was able to catch herself.  She denies any headaches or neck pain.  She denies any other injuries.  Patient has been able to ambulate.          Past Medical History:  Diagnosis Date   Hyperlipidemia    Hypertension    Occipital neuralgia    Occipital neuralgia    Torticollis     Patient Active Problem List   Diagnosis Date Noted   Slow transit constipation 10/08/2016   Anxiety attack 04/15/2015   History of adenomatous polyp of colon 04/15/2015   Hyperlipidemia, mixed 01/21/2015   Orthopnea 01/18/2015   Atypical chest pain 01/17/2015   Essential (primary) hypertension 07/31/2014   Occipital  neuralgia 07/31/2014   Nicotine dependence, uncomplicated 07/31/2014    Past Surgical History:  Procedure Laterality Date   TUBAL LIGATION  2002    Prior to Admission medications   Medication Sig Start Date End Date Taking? Authorizing Provider  ARIPiprazole (ABILIFY) 5 MG tablet Take 15 mg by mouth. 05/13/20   [provider]  Ascorbic Acid (VITAMIN C PO) Take by mouth.    [provider]  chlorthalidone (HYGROTON) 25 MG tablet Take 1 tablet (25 mg total) by mouth daily. 10/09/19   Duanne Limerick, MD  Cyanocobalamin (VITAMIN B12 PO) Take by mouth.    [provider]  diazepam (VALIUM) 5 MG tablet Take 5 mg by mouth 2 (two) times daily. 05/13/20   [provider]  gabapentin (NEURONTIN) 300 MG capsule Take 1 capsule by mouth 4 (four) times daily. Porter/ cbc 03/07/18   [provider]  ibuprofen (ADVIL,MOTRIN) 600 MG tablet Take 1 tablet (600 mg total) by mouth every 8 (eight) hours as needed. 10/05/16   Merrily Brittle, MD  lamoTRIgine (LAMICTAL) 25 MG tablet Take by mouth. 05/13/20   [provider]  ondansetron (ZOFRAN) 4 MG tablet Take 1 tablet (4 mg total) by mouth every 8 (eight) hours as needed for nausea or vomiting. 01/03/20   Duanne Limerick, MD  pantoprazole (PROTONIX) 40 MG tablet Take 1 tablet (40 mg total) by  mouth daily. 01/03/20   Duanne Limerick, MD  pravastatin (PRAVACHOL) 40 MG tablet Take 1 tablet (40 mg total) by mouth daily. 10/09/19   Duanne Limerick, MD  progesterone (PROMETRIUM) 100 MG capsule Take 100 mg by mouth at bedtime. 02/07/20   [provider]  traZODone (DESYREL) 100 MG tablet Take 50-100 mg by mouth at bedtime as needed. 05/13/20   [provider]  VITAMIN D PO Take by mouth.    [provider]    Allergies Influenza virus vaccine, Betadine [povidone iodine], and Povidone-iodine  Family History  Problem Relation Age of Onset   Hypertension Mother    Hypertension Sister     Diabetes Sister     Social History Social History   Tobacco Use   Smoking status: Every Day    Packs/day: 1.50    Types: Cigarettes   Smokeless tobacco: Never  Substance Use Topics   Alcohol use: Yes    Alcohol/week: 2.0 standard drinks    Types: 2 Standard drinks or equivalent per week    Comment: 3-4 beers/day   Drug use: Never      Review of Systems Constitutional: No fever/chills, syncope Eyes: No visual changes. ENT: No sore throat. Cardiovascular: Denies chest pain. Respiratory: Denies shortness of breath. Gastrointestinal: No abdominal pain.  No nausea, no vomiting.  No diarrhea.  No constipation. Genitourinary: Negative for dysuria. Musculoskeletal: Negative for back pain. Skin: Negative for rash. Neurological: Negative for headaches, focal weakness or numbness. All other ROS negative ____________________________________________   PHYSICAL EXAM:  VITAL SIGNS: Blood pressure (!) 158/107, pulse 80, temperature 98.5 F (36.9 C), temperature source Oral, resp. rate 16, height 5\' 3"  (1.6 m), weight 94.3 kg, SpO2 99 %.   Constitutional: Alert and oriented. Well appearing and in no acute distress. Eyes: Conjunctivae are normal. EOMI. Head: Atraumatic. Nose: No congestion/rhinnorhea. Mouth/Throat: Mucous membranes are moist.   Neck: No stridor. Trachea Midline. FROM Cardiovascular: Normal rate, regular rhythm. Grossly normal heart sounds.  Good peripheral circulation. Respiratory: Normal respiratory effort.  No retractions. Lungs CTAB. Gastrointestinal: Soft and nontender. No distention. No abdominal bruits.  Musculoskeletal: No lower extremity tenderness nor edema.  No joint effusions. Neurologic:  Normal speech and language. No gross focal neurologic deficits are appreciated.  Equal strength in arms and legs.  Able to lift up both legs up off the bed. Skin:  Skin is warm, dry and intact. No rash noted. Psychiatric: Mood and affect are normal. Speech and  behavior are normal. GU: Deferred   ____________________________________________   LABS (all labs ordered are listed, but only abnormal results are displayed)  Labs Reviewed  CBC WITH DIFFERENTIAL/PLATELET - Abnormal; Notable for the following components:      Result Value   WBC 10.9 (*)    MCH 35.7 (*)    All other components within normal limits  COMPREHENSIVE METABOLIC PANEL - Abnormal; Notable for the following components:   Potassium 3.2 (*)    Glucose, Bld 123 (*)    Calcium 8.7 (*)    All other components within normal limits  URINALYSIS, ROUTINE W REFLEX MICROSCOPIC - Abnormal; Notable for the following components:   Specific Gravity, Urine <1.005 (*)    Hgb urine dipstick TRACE (*)    All other components within normal limits  TROPONIN I (HIGH SENSITIVITY)  TROPONIN I (HIGH SENSITIVITY)   ____________________________________________   ED ECG REPORT I, , the attending physician, personally viewed and interpreted this ECG.  Normal sinus rate of  75, no ST elevation, no T wave inversions, QTC is 503  Normal sinus rate of 81, no ST elevation, no T wave inversions, QTC of 501 ____________________________________________    PROCEDURES  Procedure(s) performed (including Critical Care):  .1-3 Lead EKG Interpretation  Date/Time: 09/16/2020 6:51 AM Performed by: Concha SeFunke, Arthelia Callicott E, MD Authorized by: Concha SeFunke, Alek Borges E, MD     Interpretation: normal     ECG rate:  70s   ECG rate assessment: normal     Rhythm: sinus rhythm     Ectopy: none     Conduction: normal     ____________________________________________   INITIAL IMPRESSION / ASSESSMENT AND PLAN / ED COURSE  Felicia Garcia was evaluated in Emergency Department on 09/16/2020 for the symptoms described in the history of present illness. She was evaluated in the context of the global COVID-19 pandemic, which necessitated consideration that the patient might be at risk for infection with the SARS-CoV-2 virus  that causes COVID-19. Institutional protocols and algorithms that pertain to the evaluation of patients at risk for COVID-19 are in a state of rapid change based on information released by regulatory bodies including the CDC and federal and state organizations. These policies and algorithms were followed during the patient's care in the ED.    Patient comes in with syncopal episode most likely secondary to patient taking multiple medications including Valium, gabapentin in the setting of drinking alcohol.  Patient does not appear acutely intoxicated at this time but I suspect that this caused her blood pressure to drop and cause a syncopal episode.  We will get labs to make sure evidence of Electra abnormalities, AKI and EKG cardiac markers to evaluate for ACS, arrhythmia.  We will keep patient the cardiac monitor  Patient's QTC was slightly prolonged on EKG at 500.  However she has no episodes of arrhythmias and her magnesium is normal normal.  Her potassium was slightly low which I given some oral repletion for.  Again I doubt that the syncope is related to this and I suspect this more likely related to all the substances.  She is not on any significant QTC prolonged her medications  On repeat evaluation patient is very sleepy but she is able to wake up.  Patient has been ambulatory around the room and her work-up is thus far reassuring.  Again I discussed with the family and I suspect that this is more likely related to all the combination of alcohol with all of her medications.  At this time patient feels comfortable going home.  She denies any SI.  7:36 AM repeat evaluation patient states that she is ready to go home.  We discussed her abnormal EKG and she will double her potassium at home for the next 2 days and follow-up with her primary care doctor for recheck of EKG and potassium.  She denies any chest pain or shortness of breath and feels at her baseline self.  She states that this all happened  because of her anxiety and having gotten into the argument and would like to be discharged at this time.  We discussed not mixing her alcohol with other medications.  I discussed the provisional nature of ED diagnosis, the treatment so far, the ongoing plan of care, follow up appointments and return precautions with the patient and any family or support people present. They expressed understanding and agreed with the plan, discharged home.         ____________________________________________   FINAL CLINICAL IMPRESSION(S) / ED DIAGNOSES  Final diagnoses:  Syncope and collapse      MEDICATIONS GIVEN DURING THIS VISIT:  Medications  sodium chloride 0.9 % bolus 500 mL (500 mLs Intravenous New Bag/Given 09/16/20 0453)  potassium chloride SA (KLOR-CON) CR tablet 40 mEq (40 mEq Oral Given 09/16/20 0606)     ED Discharge Orders     None        Note:  This document was prepared using Dragon voice recognition software and may include unintentional dictation errors.    Concha Se, MD 09/16/20 [id number]    Concha Se, MD 09/16/20 Jesusita Oka    Concha Se, MD 09/16/20 [phone]

## 2020-09-16 NOTE — ED Notes (Signed)
Pt walk around room. NADN noted. Pt back in bed. Speaking with family at bedside

## 2020-10-18 ENCOUNTER — Ambulatory Visit: Payer: Self-pay | Admitting: *Deleted

## 2020-10-18 NOTE — Telephone Encounter (Signed)
Reason for Disposition  Systolic BP  >= 180 OR Diastolic >= 110  Answer Assessment - Initial Assessment Questions 1. BLOOD PRESSURE: "What is the blood pressure?" "Did you take at least two measurements 5 minutes apart?"     Pt calling in c/o 193/120 BP.  High for 2 weeks.   Today I got it down.    I'm on losartan but it was stopped.  My sister works in a pharmacy and told me to call.    I've been off the losartan a yr.   I lost a lot of wt but I've gained wt.   2. ONSET: "When did you take your blood pressure?"     It's 135/85 now   Day before Wed. It was 193/120.   I got my BP down.   I taking mustard and vinegar which got it down.   3. HOW: "How did you obtain the blood pressure?" (e.g., visiting nurse, automatic home BP monitor)     Home BP monitor 4. HISTORY: "Do you have a history of high blood pressure?"     Yes 5. MEDICATIONS: "Are you taking any medications for blood pressure?" "Have you missed any doses recently?"     No but was on losartan a yr ago but it was stopped when I lost a lot of wt. My BP went down but I've gained the weight back.   6. OTHER SYMPTOMS: "Do you have any symptoms?" (e.g., headache, chest pain, blurred vision, difficulty breathing, weakness)     I can tell my BP is up it's a pressure like a tight hat on my head. 7. PREGNANCY: "Is there any chance you are pregnant?" "When was your last menstrual period?"     Not asked  Protocols used: Blood Pressure - High-A-AH

## 2020-10-18 NOTE — Telephone Encounter (Signed)
Pt called in concerned about her BP being elevated since she has gained weight.   A yr ago Dr. Yetta Barre took her off of losartan because she had lost a lot of weight but now she has gained it back and her BP is elevated.   I tried to get her a virtual visit with Dr. Yetta Barre at her request, she just started a new job and can't get the time off.   There wasn't anything available until 10/10 with Dr. Yetta Barre.   I had called into the office and Dr. Yetta Barre only sees pts virtually if they have Covid they must be seen.   Pt can't take time off of work until Nov.   I suggested she go to the urgent care to get medication to last until she can be seen in Nov. And not loose her new job.  She was agreeable to going to the urgent care so she would not have to miss work and loose her new job. She was agreeable to this plan.  I went over the s/s to look for and go to the ED in the care advice.   She verbalized understanding.

## 2020-12-26 ENCOUNTER — Other Ambulatory Visit: Payer: Self-pay | Admitting: Family Medicine

## 2020-12-26 DIAGNOSIS — E785 Hyperlipidemia, unspecified: Secondary | ICD-10-CM

## 2020-12-26 DIAGNOSIS — I10 Essential (primary) hypertension: Secondary | ICD-10-CM

## 2021-02-10 ENCOUNTER — Other Ambulatory Visit: Payer: Self-pay

## 2021-02-10 ENCOUNTER — Emergency Department: Payer: 59

## 2021-02-10 ENCOUNTER — Emergency Department
Admission: EM | Admit: 2021-02-10 | Discharge: 2021-02-11 | Disposition: A | Discharge status: Other Acute Inpatient Hospital | Payer: 59 | Attending: Emergency Medicine | Admitting: Emergency Medicine

## 2021-02-10 DIAGNOSIS — R45851 Suicidal ideations: Secondary | ICD-10-CM | POA: Diagnosis not present

## 2021-02-10 DIAGNOSIS — I10 Essential (primary) hypertension: Secondary | ICD-10-CM | POA: Diagnosis not present

## 2021-02-10 DIAGNOSIS — F39 Unspecified mood [affective] disorder: Secondary | ICD-10-CM | POA: Insufficient documentation

## 2021-02-10 DIAGNOSIS — Z046 Encounter for general psychiatric examination, requested by authority: Secondary | ICD-10-CM | POA: Diagnosis not present

## 2021-02-10 DIAGNOSIS — W109XXA Fall (on) (from) unspecified stairs and steps, initial encounter: Secondary | ICD-10-CM | POA: Diagnosis not present

## 2021-02-10 DIAGNOSIS — Z20822 Contact with and (suspected) exposure to covid-19: Secondary | ICD-10-CM | POA: Diagnosis not present

## 2021-02-10 DIAGNOSIS — S0990XA Unspecified injury of head, initial encounter: Secondary | ICD-10-CM | POA: Insufficient documentation

## 2021-02-10 LAB — CBC
HCT: 42.7 % (ref 36.0–46.0)
Hemoglobin: 14.7 g/dL (ref 12.0–15.0)
MCH: 33.9 pg (ref 26.0–34.0)
MCHC: 34.4 g/dL (ref 30.0–36.0)
MCV: 98.4 fL (ref 80.0–100.0)
Platelets: 294 10*3/uL (ref 150–400)
RBC: 4.34 MIL/uL (ref 3.87–5.11)
RDW: 12.3 % (ref 11.5–15.5)
WBC: 8.6 10*3/uL (ref 4.0–10.5)
nRBC: 0 % (ref 0.0–0.2)

## 2021-02-10 LAB — COMPREHENSIVE METABOLIC PANEL
ALT: 21 U/L (ref 0–44)
AST: 24 U/L (ref 15–41)
Albumin: 3.8 g/dL (ref 3.5–5.0)
Alkaline Phosphatase: 72 U/L (ref 38–126)
Anion gap: 10 (ref 5–15)
BUN: 12 mg/dL (ref 6–20)
CO2: 25 mmol/L (ref 22–32)
Calcium: 9 mg/dL (ref 8.9–10.3)
Chloride: 103 mmol/L (ref 98–111)
Creatinine, Ser: 0.91 mg/dL (ref 0.44–1.00)
GFR, Estimated: >60 mL/min (ref >60)
Glucose, Bld: 114 mg/dL — ABNORMAL HIGH (ref 70–99)
Potassium: 3.5 mmol/L (ref 3.5–5.1)
Sodium: 138 mmol/L (ref 135–145)
Total Bilirubin: 0.7 mg/dL (ref 0.3–1.2)
Total Protein: 6.9 g/dL (ref 6.5–8.1)

## 2021-02-10 LAB — ACETAMINOPHEN LEVEL: Acetaminophen (Tylenol), Serum: <10 ug/mL — ABNORMAL LOW (ref 10–30)

## 2021-02-10 LAB — URINE DRUG SCREEN, QUALITATIVE (ARMC ONLY)
Amphetamines, Ur Screen: POSITIVE — AB
Barbiturates, Ur Screen: NOT DETECTED
Benzodiazepine, Ur Scrn: NOT DETECTED
Cannabinoid 50 Ng, Ur ~~LOC~~: NOT DETECTED
Cocaine Metabolite,Ur ~~LOC~~: NOT DETECTED
MDMA (Ecstasy)Ur Screen: NOT DETECTED
Methadone Scn, Ur: NOT DETECTED
Opiate, Ur Screen: NOT DETECTED
Phencyclidine (PCP) Ur S: NOT DETECTED
Tricyclic, Ur Screen: NOT DETECTED

## 2021-02-10 LAB — SALICYLATE LEVEL: Salicylate Lvl: <7 mg/dL — ABNORMAL LOW (ref 7.0–30.0)

## 2021-02-10 LAB — ETHANOL: Alcohol, Ethyl (B): <10 mg/dL (ref <10)

## 2021-02-10 LAB — RESP PANEL BY RT-PCR (FLU A&B, COVID) ARPGX2
Influenza A by PCR: NEGATIVE
Influenza B by PCR: NEGATIVE
SARS Coronavirus 2 by RT PCR: NEGATIVE

## 2021-02-10 LAB — POC URINE PREG, ED: Preg Test, Ur: NEGATIVE

## 2021-02-10 MED ORDER — TRAZODONE HCL 50 MG PO TABS: 50.0000 mg | ORAL_TABLET | Freq: Every evening | ORAL | Status: DC | PRN

## 2021-02-10 MED ORDER — PROGESTERONE MICRONIZED 100 MG PO CAPS
100.0000 mg | ORAL_CAPSULE | Freq: Every day | ORAL | Status: DC
  Filled 2021-02-10: qty 1

## 2021-02-10 MED ORDER — ARIPIPRAZOLE 15 MG PO TABS
15.0000 mg | ORAL_TABLET | Freq: Every day | ORAL | Status: DC
  Administered 2021-02-10: 15 mg via ORAL
  Filled 2021-02-10: qty 1

## 2021-02-10 MED ORDER — PANTOPRAZOLE SODIUM 40 MG PO TBEC
40.0000 mg | DELAYED_RELEASE_TABLET | Freq: Every day | ORAL | Status: DC
  Administered 2021-02-10: 40 mg via ORAL
  Filled 2021-02-10: qty 1

## 2021-02-10 MED ORDER — PROGESTERONE MICRONIZED 100 MG PO CAPS: 100.0000 mg | ORAL_CAPSULE | Freq: Every day | ORAL | Status: DC

## 2021-02-10 MED ORDER — PRAVASTATIN SODIUM 20 MG PO TABS
40.0000 mg | ORAL_TABLET | Freq: Every day | ORAL | Status: DC
  Administered 2021-02-10: 40 mg via ORAL
  Filled 2021-02-10: qty 2

## 2021-02-10 MED ORDER — CHLORTHALIDONE 25 MG PO TABS
25.0000 mg | ORAL_TABLET | Freq: Every day | ORAL | Status: DC
  Administered 2021-02-10: 25 mg via ORAL
  Filled 2021-02-10 (×2): qty 1

## 2021-02-10 MED ORDER — IBUPROFEN 600 MG PO TABS: 600.0000 mg | ORAL_TABLET | Freq: Three times a day (TID) | ORAL | Status: DC | PRN
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
## 2021-02-10 NOTE — BH Assessment (Signed)
Patient has been accepted to [facility].  Accepting physician is Dr. Loyola Mast.  Call report to [phone].  Representative was [employer].   ER Staff is aware of it:  Sport and exercise psychologist  Dr. Katrinka Blazing, ER MD  Florentina Addison, Patient's Nurse     Patient can arrive anytime after 7 AM 02/11/21.

## 2021-02-10 NOTE — ED Notes (Signed)
IVC/ Consult completed/Accepted to [facility] in [place] 
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
## 2021-02-10 NOTE — ED Notes (Signed)
IVC PENDING  CONSULT ?

## 2021-02-10 NOTE — ED Notes (Signed)
Patient accepted to [facility] after 7am 1/31 admit doctor is Dr Loyola Mast, admit #[id number] [phone]

## 2021-02-10 NOTE — ED Notes (Addendum)
Pt clothing in bag with partner  1 Black Bra 1 Red shirt  1 Pair of gray socks  1 Nose ring  1 Pair of Camo pants   1 Pair of black crocs  1 pair of pink underwear
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
## 2021-02-10 NOTE — ED Provider Notes (Signed)
The patient has been placed in psychiatric observation due to the need to provide a safe environment for the patient while obtaining psychiatric consultation and evaluation, as well as ongoing medical and medication management to treat the patient's condition.  The patient has been placed under full IVC at this time.    Gilles Chiquito, MD 02/10/21 [phone]
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
## 2021-02-10 NOTE — ED Triage Notes (Addendum)
Patient to ER via POV with complaints of a head injury after falling yesterday. Patient reports falling outside down several steps off her porch and hitting the center of her forehead. Reports tenderness around chin and nose. No obvious deformity or external injury noted. Reports headache yesterday that was relieved by excedrin/ motrin. Denies LOC. Denies blood thinner usage. A/Ox4.

## 2021-02-10 NOTE — ED Notes (Signed)
TTS in room at this time.  °

## 2021-02-10 NOTE — BH Assessment (Signed)
Referral information for Psychiatric Hospitalization faxed to:  [facility] [phone]- [phone])  [facility] [phone]- [phone]),   [facility] Hoes [phone]),  [address] [phone]),   [place] [phone]- [phone]),   [facility] [phone]).  [facility] [phone])

## 2021-02-11 NOTE — ED Notes (Signed)
Pt ambulated to restroom. 

## 2021-02-11 NOTE — ED Notes (Signed)
VS assess. Shower offered.

## 2021-02-11 NOTE — ED Notes (Signed)
BREAKFAST TRAY GIVEN 

## 2021-02-11 NOTE — ED Notes (Signed)
Pt requested shower; provided clean hospital clothing and linens.  Shower setup provided with soap, shampoo, toothbrush/toothpaste, and deoderant.  Pt able to preform own ADL's with no assistance.  Cont to monitor as ordered ° °

## 2021-02-11 NOTE — ED Notes (Signed)
Arranged transport waiting on [facility]

## 2021-02-11 NOTE — ED Notes (Signed)
Shower supplies given. Pt is currently taking a shower at this moment.

## 2021-02-11 NOTE — ED Provider Notes (Signed)
Emergency Medicine Observation Re-evaluation Note  Felicia Garcia is a 46 y.o. female, seen on rounds today.  Pt initially presented to the ED for complaints of Fall Currently, the patient is resting.  Physical Exam  BP (!) 154/71    Pulse (!) 106    Temp 99.1 F (37.3 C) (Oral)    Resp 18    Ht 1.613 m (5' 3.5")    Wt 95.3 kg    LMP 01/31/2021    SpO2 95%    BMI 36.62 kg/m  Physical Exam Gen:  No acute distress Resp:  Breathing easily and comfortably, no accessory muscle usage Neuro:  Moving all four extremities, no gross focal neuro deficits Psych:  Resting currently, calm when awake  ED Course / MDM  EKG:   I have reviewed the labs performed to date as well as medications administered while in observation.  Recent changes in the last 24 hours include evaluation by EDP, psychiatry, and TTS.  Plan  [facility].  [facility] documentation completed except for reassessment prior to transport. Felicia Garcia is under involuntary commitment.      Felicia Rose, MD 02/11/21 [phone]

## 2021-03-26 ENCOUNTER — Other Ambulatory Visit: Payer: Self-pay | Admitting: Family Medicine

## 2021-03-27 NOTE — Telephone Encounter (Signed)
Requested medication (s) are due for refill today:   Not sure ? ?Requested medication (s) are on the active medication list:   Yes as a historical med. ? ?Future visit scheduled:   No ? ? ?Last ordered: 02/07/2020 ? ?Returned because there is a note on 02/10/2021 that she is no longer taking this.   It was last prescribed by a historical provider.    ? ?Requested Prescriptions  ?Pending Prescriptions Disp Refills  ? progesterone (PROMETRIUM) 100 MG capsule [Pharmacy Med Name: PROGESTERONE MICRO 100MG  CAPSULES] 30 capsule   ?  Sig: TAKE 1 CAPSULE BY MOUTH AT BEDTIME  ?  ? OB/GYN:  Progestins Failed - 03/26/2021  5:44 PM  ?  ?  Failed - Last BP in normal range  ?  BP Readings from Last 1 Encounters:  ?02/11/21 (!) 149/85  ?  ?  ?  ?  Failed - Patient is not a smoker  ?  ?  Passed - Valid encounter within last 12 months  ?  Recent Outpatient Visits   ? ?      ? 9 months ago Pityriasis rosea  ? [facility], [place]  ? 1 year ago Acute gastritis without hemorrhage, unspecified gastritis type  ? [facility], MD  ? 1 year ago Hyperlipidemia, unspecified hyperlipidemia type  ? [facility], MD  ? 2 years ago Screening breast examination  ? [facility], MD  ? 2 years ago Essential (primary) hypertension  ? [facility], MD  ? ?  ?  ? ?  ?  ?  ? ?

## 2021-03-27 NOTE — Telephone Encounter (Signed)
Patient just started a job and will call back to set up appointment for medication refills. ?

## 2023-06-02 IMAGING — CT CT HEAD W/O CM
4 series · 15 of 47 positions shown, 17 images · non-contrast
Comparison: None.

CLINICAL DATA: Head trauma, moderate-severe; Neck pain, acute, no
red flags



[Series 2: head bone · axial · 0.44mm/px · z∈[-110,-96]mm · 2 of 73 slices shown]
[im 8/73  bone]
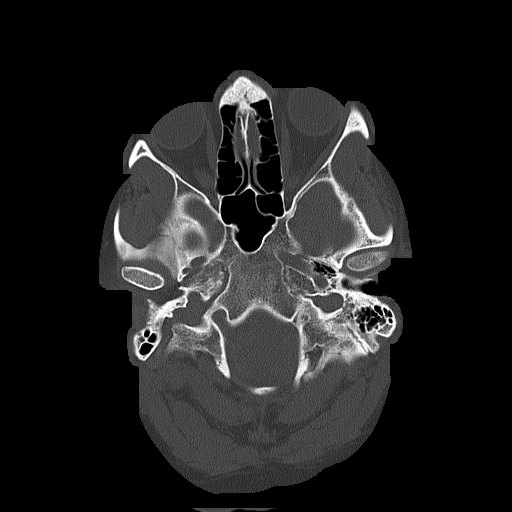
[im 15/73  bone]
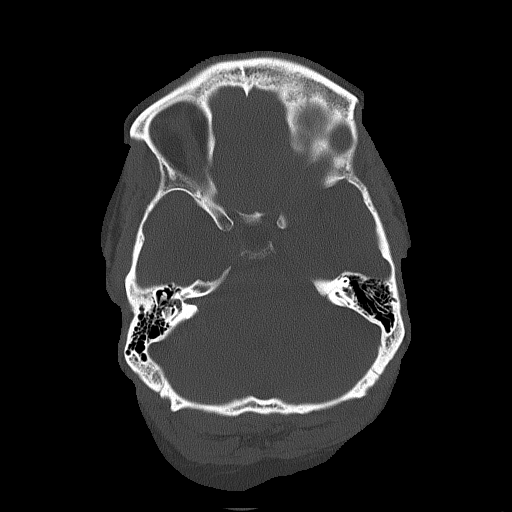

[Series 3: head wo · axial · 0.44mm/px · z∈[-109,+1]mm · 7 of 30 slices shown, 9 images]
[im 4/30  brain]
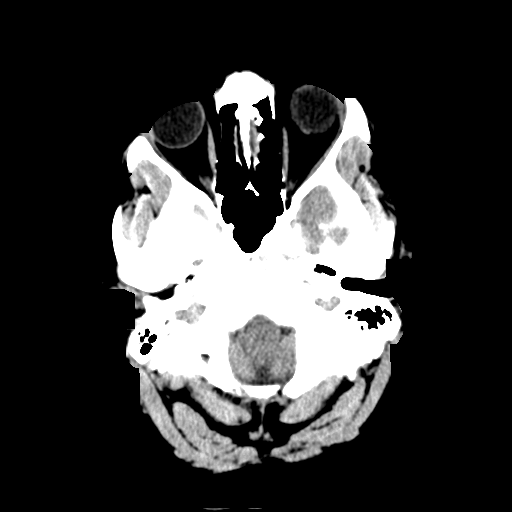
[im 4/30  bone]
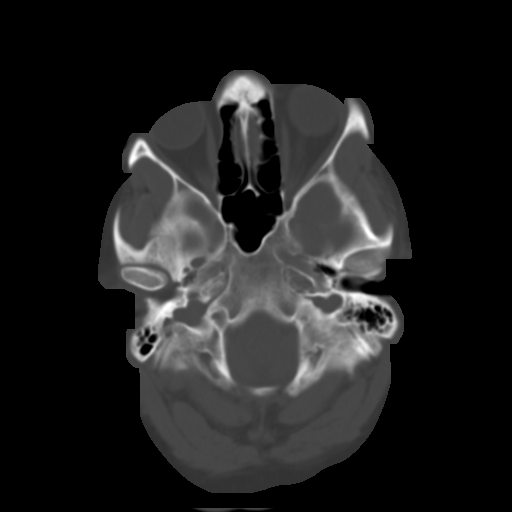
[im 8/30  brain]
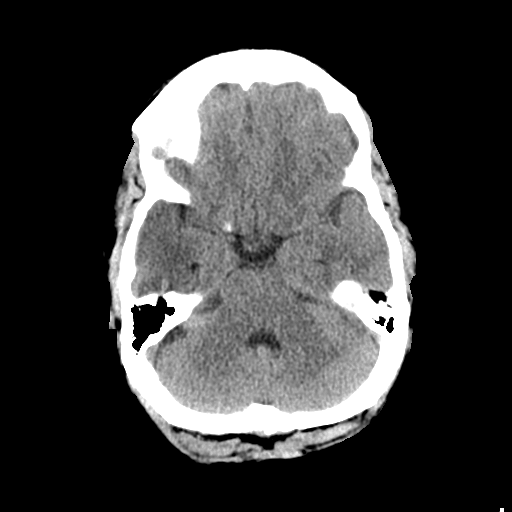
[im 11/30  brain]
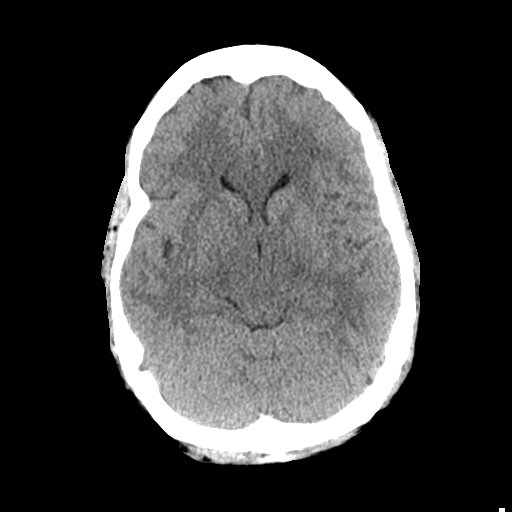
[im 15/30  brain]
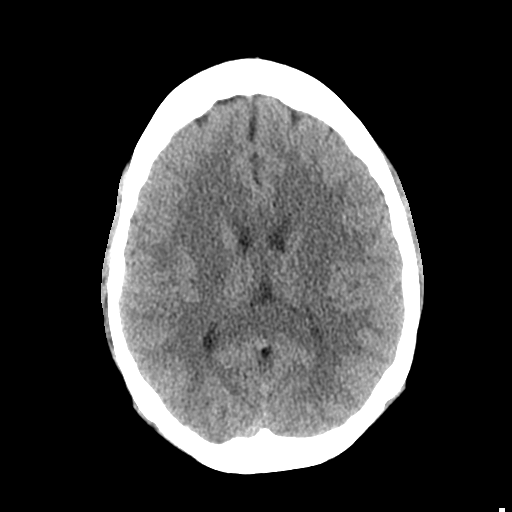
[im 19/30  brain]
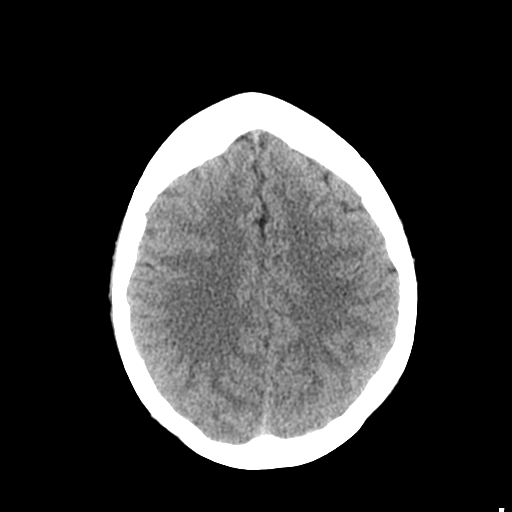
[im 19/30  bone]
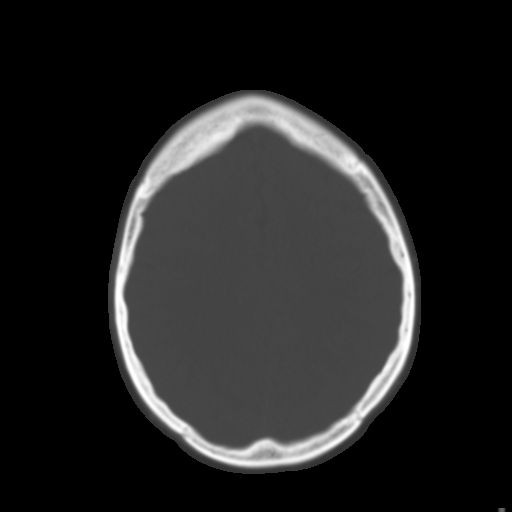
[im 22/30  brain]
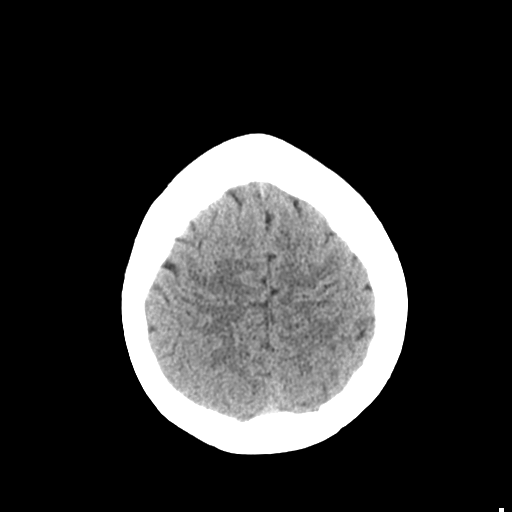
[im 26/30  brain]
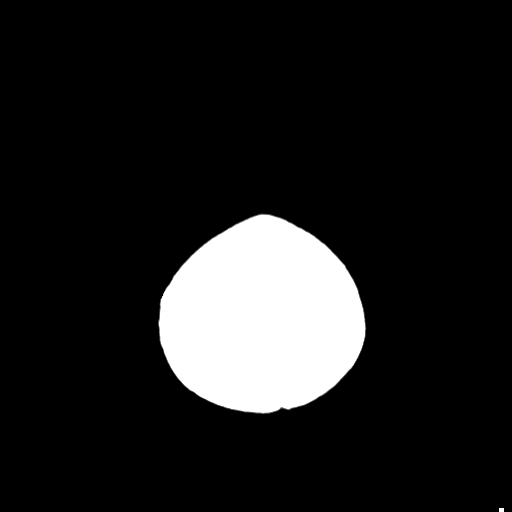

[Series 4: coronal soft tissue · coronal · 0.31mm/px · 3 of 65 slices shown]
[im 22/65  brain]
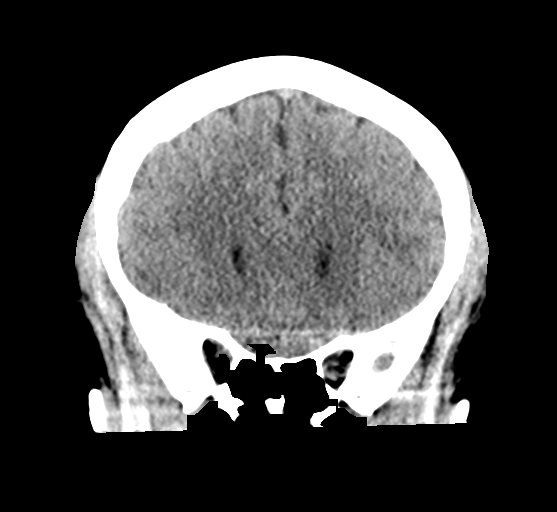
[im 29/65  brain]
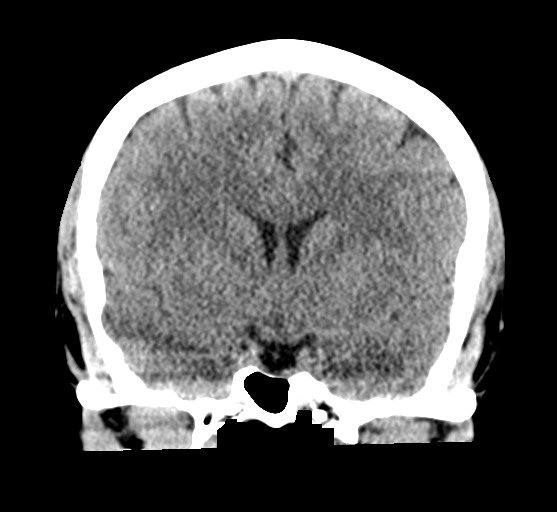
[im 36/65  brain]
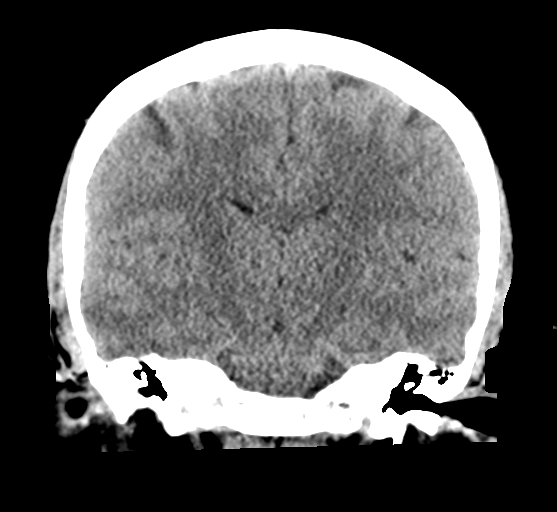

[Series 5: sagittal soft tissue · sagittal · 0.31mm/px · 3 of 54 slices shown]
[im 18/54  brain]
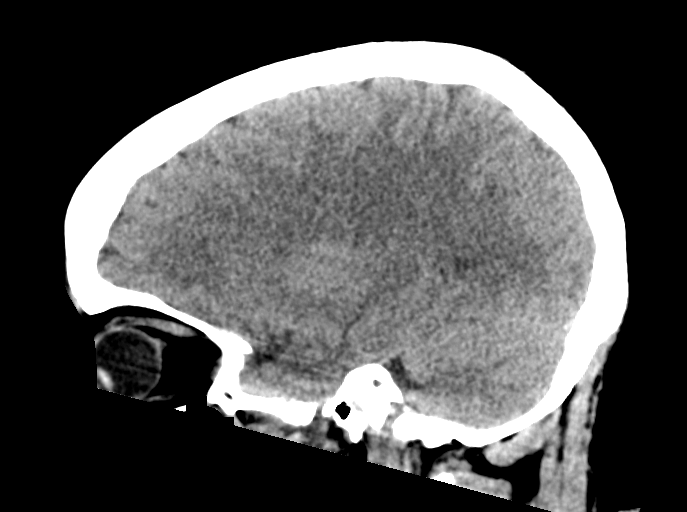
[im 27/54  brain]
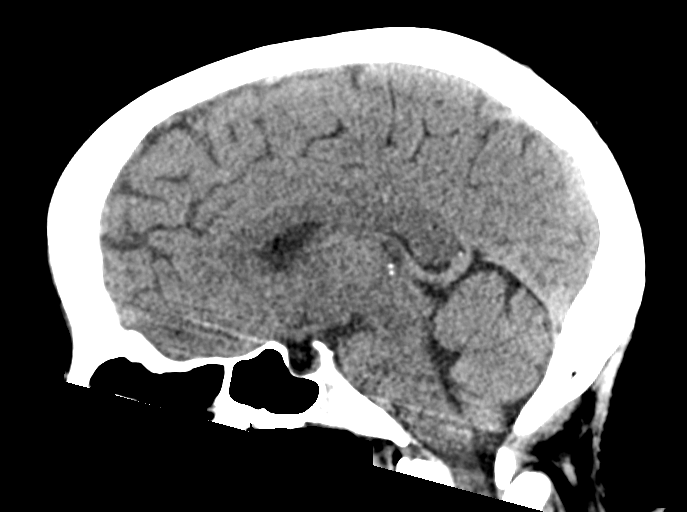
[im 36/54  brain]
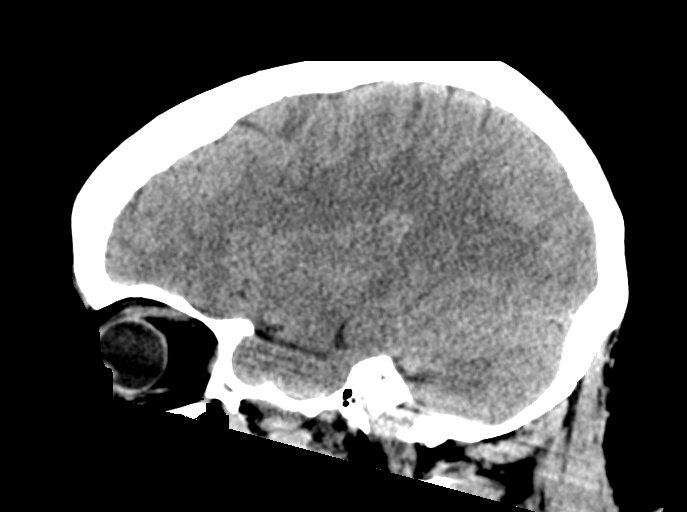

[15 of 47 positions shown; findings below may reference images not displayed]

FINDINGS: CT HEAD FINDINGS

Brain: No evidence of acute infarction, hemorrhage, hydrocephalus,
extra-axial collection or mass lesion/mass effect. Partially empty
sella.

Vascular: No hyperdense vessel identified.

Skull: No evidence of acute fracture.

Sinuses/Orbits: Visualized sinuses are clear. Visualized orbits are
unremarkable.

Other: No mastoid effusions.

CT CERVICAL SPINE FINDINGS

Alignment: Reversal the normal cervical lordosis. No substantial
sagittal subluxation. Mild levocurvature of the upper to mid
cervical spine.

Skull base and vertebrae: Vertebral body heights are maintained. No
evidence of acute fracture.

Soft tissues and spinal canal: No prevertebral fluid or swelling. No
visible canal hematoma.

Disc levels: Central disc protrusion at C2-C3 which may contact the
ventral cord. Additional suspected disc protrusion at C3-C4.

Upper chest: Visualized lung apices are clear.
IMPRESSION: CT head:

1. No evidence of acute intracranial abnormality.
2. Partially empty sella, which is often a normal anatomic variant
but can be associated with idiopathic intracranial hypertension.

CT cervical spine:

1. No evidence of acute fracture or traumatic malalignment.
2. Multilevel disc protrusions, which may contact the ventral cord.
An MRI of the cervical spine could better evaluate the canal/cord if
clinically indicated.

## 2023-06-02 IMAGING — CT CT CERVICAL SPINE W/O CM
3 of 4 series · 9 of 33 positions shown, 10 images · non-contrast
Comparison: None.

CLINICAL DATA: Head trauma, moderate-severe; Neck pain, acute, no
red flags



[Series 6: sagittal bone · sagittal · 0.21mm/px · 5 of 52 slices shown]
[im 18/52  bone]
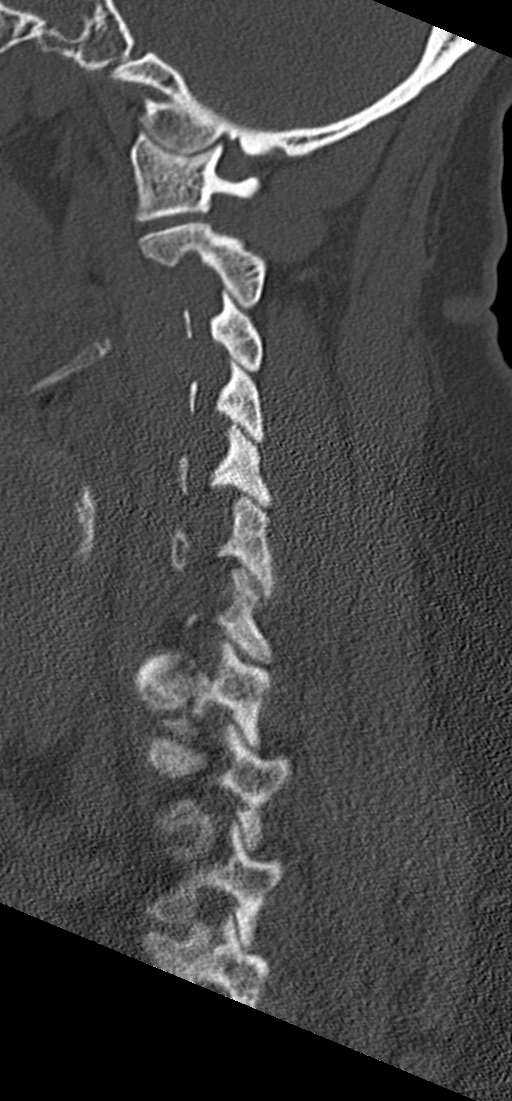
[im 22/52  bone]
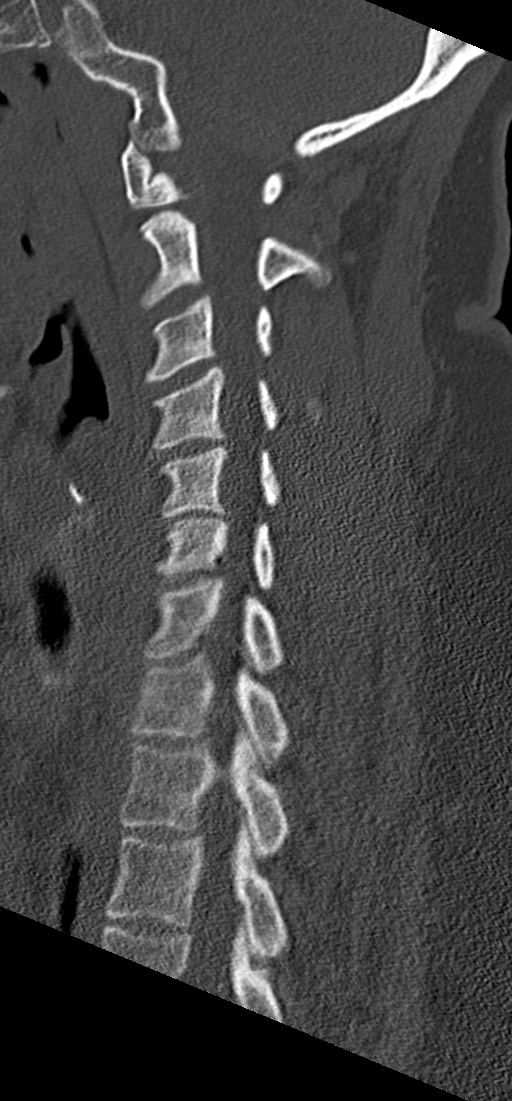
[im 26/52  bone]
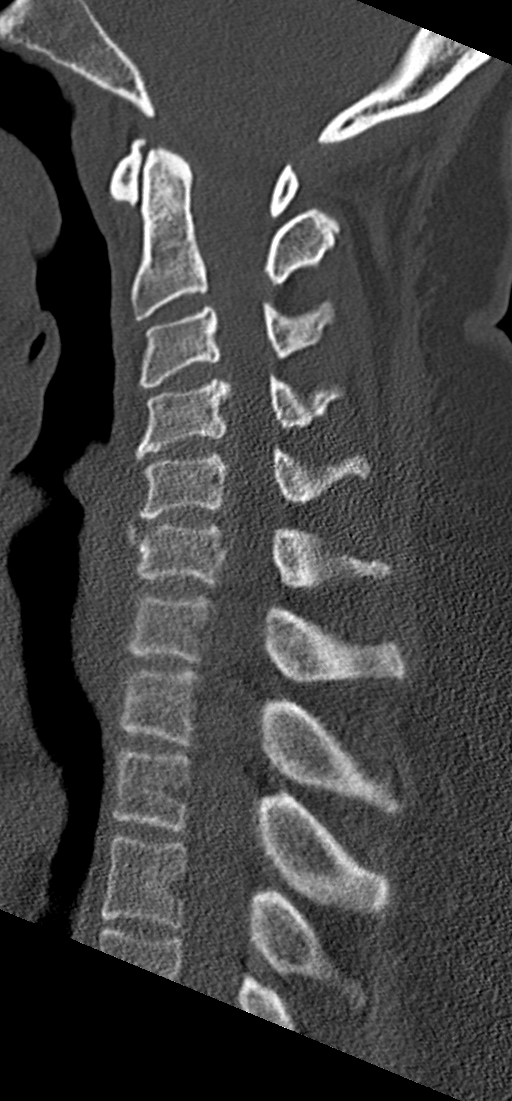
[im 30/52  bone]
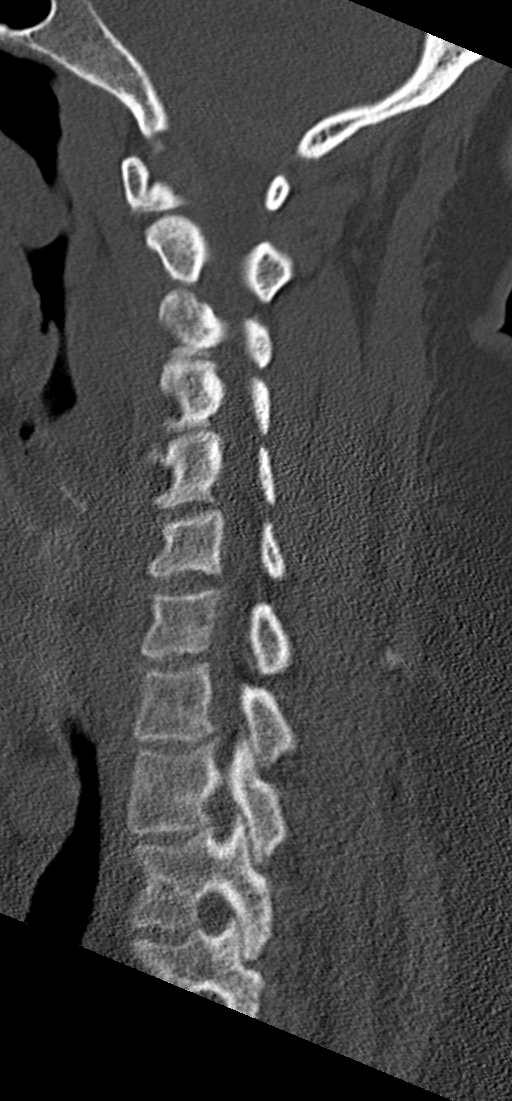
[im 35/52  bone]
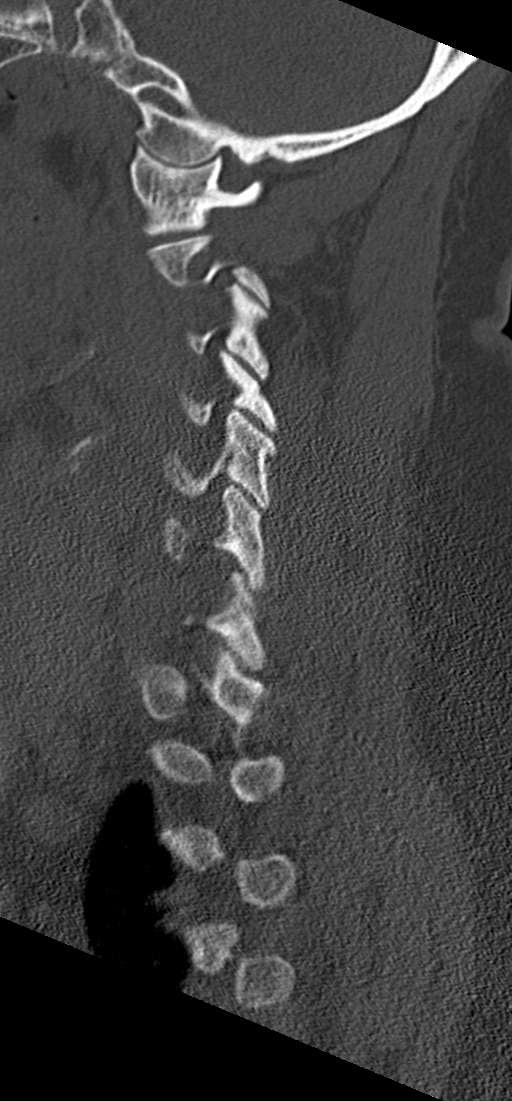

[Series 7: orthogonal bone · axial · 0.20mm/px · z∈[-225,-225]mm · 1 of 115 slices shown, 2 images]
[im 58/115  soft-tissue]
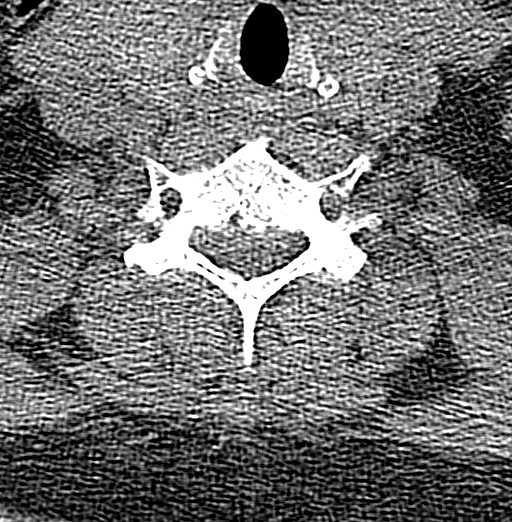
[im 58/115  bone]
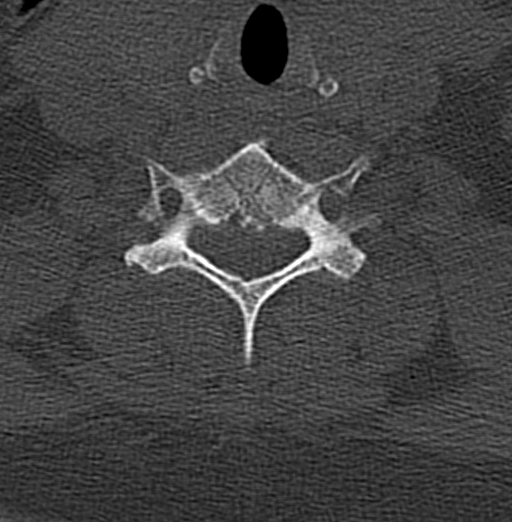

[Series 8: coronal bone · coronal · 0.20mm/px · 3 of 54 slices shown]
[im 11/54  bone]
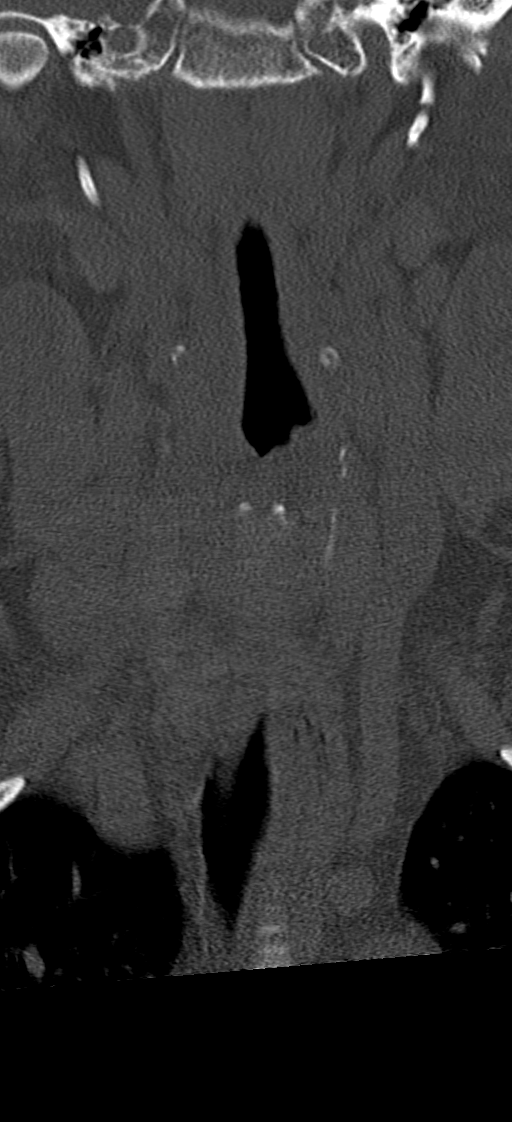
[im 22/54  bone]
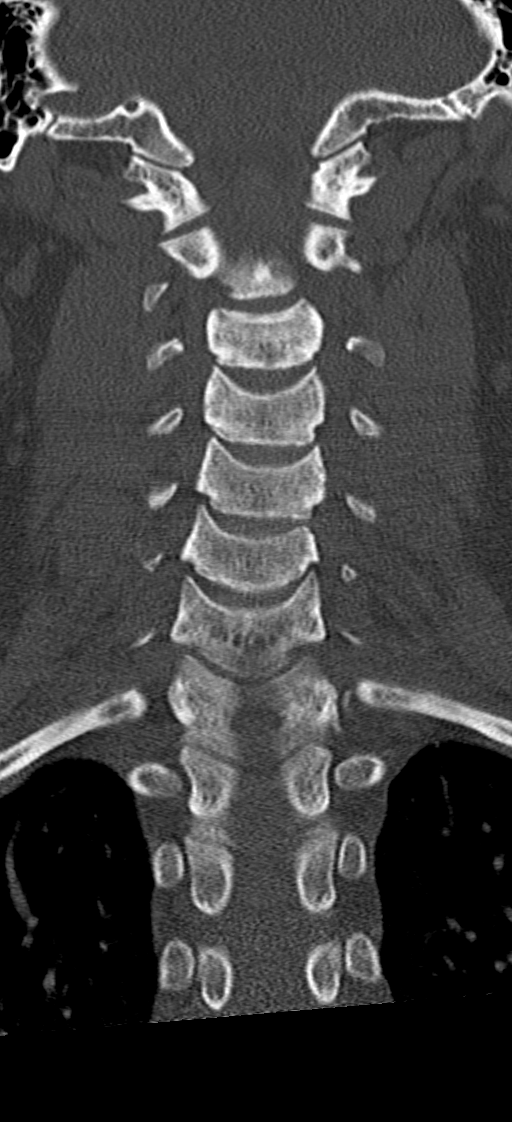
[im 32/54  bone]
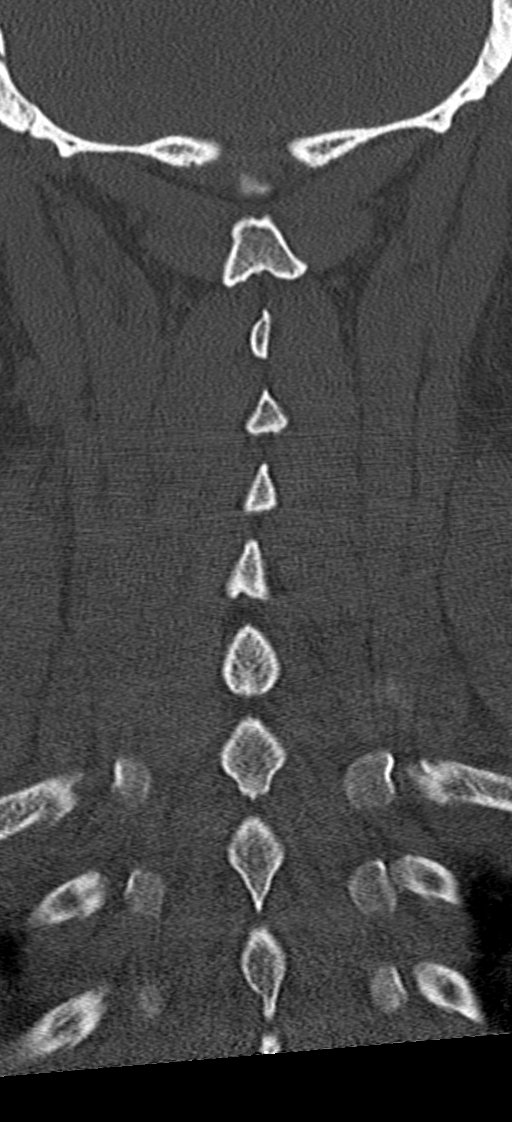

[9 of 33 positions shown; findings below may reference images not displayed]

FINDINGS: CT HEAD FINDINGS

Brain: No evidence of acute infarction, hemorrhage, hydrocephalus,
extra-axial collection or mass lesion/mass effect. Partially empty
sella.

Vascular: No hyperdense vessel identified.

Skull: No evidence of acute fracture.

Sinuses/Orbits: Visualized sinuses are clear. Visualized orbits are
unremarkable.

Other: No mastoid effusions.

CT CERVICAL SPINE FINDINGS

Alignment: Reversal the normal cervical lordosis. No substantial
sagittal subluxation. Mild levocurvature of the upper to mid
cervical spine.

Skull base and vertebrae: Vertebral body heights are maintained. No
evidence of acute fracture.

Soft tissues and spinal canal: No prevertebral fluid or swelling. No
visible canal hematoma.

Disc levels: Central disc protrusion at C2-C3 which may contact the
ventral cord. Additional suspected disc protrusion at C3-C4.

Upper chest: Visualized lung apices are clear.
IMPRESSION: CT head:

1. No evidence of acute intracranial abnormality.
2. Partially empty sella, which is often a normal anatomic variant
but can be associated with idiopathic intracranial hypertension.

CT cervical spine:

1. No evidence of acute fracture or traumatic malalignment.
2. Multilevel disc protrusions, which may contact the ventral cord.
An MRI of the cervical spine could better evaluate the canal/cord if
clinically indicated.

## 2023-06-09 ENCOUNTER — Other Ambulatory Visit: Payer: Self-pay | Admitting: Family Medicine

## 2023-06-09 DIAGNOSIS — I10 Essential (primary) hypertension: Secondary | ICD-10-CM

## 2023-06-09 NOTE — Telephone Encounter (Signed)
 Copied from CRM [id number]. Topic: Clinical - Medication Refill >> Jun 09, 2023  2:58 PM Bearl Botts E wrote: Medication:  chlorthalidone  (HYGROTON ) 25 MG tablet   Has the patient contacted their pharmacy? Yes (Agent: If no, request that the patient contact the pharmacy for the refill. If patient does not wish to contact the pharmacy document the reason why and proceed with request.) (Agent: If yes, when and what did the pharmacy advise?)  This is the patient's preferred pharmacy:   [id number] - [place], [place] - [address] [place] [address] Phone: [phone] Fax: [phone]     Is this the correct pharmacy for this prescription? Yes If no, delete pharmacy and type the correct one.   Has the prescription been filled recently? Yes  Is the patient out of the medication? Yes  Has the patient been seen for an appointment in the last year OR does the patient have an upcoming appointment? Yes  Can we respond through MyChart? Yes  Agent: Please be advised that Rx refills may take up to 3 business days. We ask that you follow-up with your pharmacy.

## 2023-06-11 NOTE — Telephone Encounter (Signed)
 Refused Hygroton  25 mg because LOV 06/06/2020.   No appts since.   Request is from [place].
